# Patient Record
Sex: Male | Born: 1960 | Race: White | Hispanic: No | Marital: Married | Smoking: Current every day smoker
Health system: Southern US, Community
[De-identification: ages and names within clinical notes are randomized; demographics above are authoritative.]

## PROBLEM LIST (undated history)

## (undated) DIAGNOSIS — Z8739 Personal history of other diseases of the musculoskeletal system and connective tissue: Secondary | ICD-10-CM

## (undated) DIAGNOSIS — R519 Headache, unspecified: Secondary | ICD-10-CM

## (undated) DIAGNOSIS — I10 Essential (primary) hypertension: Secondary | ICD-10-CM

## (undated) DIAGNOSIS — K219 Gastro-esophageal reflux disease without esophagitis: Secondary | ICD-10-CM

## (undated) DIAGNOSIS — IMO0001 Reserved for inherently not codable concepts without codable children: Secondary | ICD-10-CM

## (undated) DIAGNOSIS — H9190 Unspecified hearing loss, unspecified ear: Secondary | ICD-10-CM

## (undated) DIAGNOSIS — R51 Headache: Secondary | ICD-10-CM

## (undated) DIAGNOSIS — E785 Hyperlipidemia, unspecified: Secondary | ICD-10-CM

## (undated) HISTORY — DX: Headache: R51

## (undated) HISTORY — DX: Headache, unspecified: R51.9

## (undated) HISTORY — DX: Unspecified hearing loss, unspecified ear: H91.90

## (undated) HISTORY — DX: Personal history of other diseases of the musculoskeletal system and connective tissue: Z87.39

## (undated) HISTORY — PX: KNEE SURGERY: SHX244

## (undated) HISTORY — DX: Hyperlipidemia, unspecified: E78.5

---

## 2008-09-23 ENCOUNTER — Emergency Department: Payer: Self-pay | Admitting: Emergency Medicine

## 2010-05-29 ENCOUNTER — Ambulatory Visit: Payer: Self-pay | Admitting: Internal Medicine

## 2012-05-30 ENCOUNTER — Ambulatory Visit: Payer: Self-pay

## 2012-05-30 LAB — CBC WITH DIFFERENTIAL/PLATELET
Basophil #: 0.1 10*3/uL (ref 0.0–0.1)
Basophil %: 1.2 %
Eosinophil #: 0.2 10*3/uL (ref 0.0–0.7)
Eosinophil %: 1.9 %
HCT: 42.3 % (ref 40.0–52.0)
HGB: 14.4 g/dL (ref 13.0–18.0)
Lymphocyte #: 2.9 10*3/uL (ref 1.0–3.6)
Lymphocyte %: 27.9 %
MCH: 31.6 pg (ref 26.0–34.0)
MCHC: 34.1 g/dL (ref 32.0–36.0)
MCV: 93 fL (ref 80–100)
Monocyte #: 0.9 x10 3/mm (ref 0.2–1.0)
Monocyte %: 8.5 %
Neutrophil #: 6.2 10*3/uL (ref 1.4–6.5)
Neutrophil %: 60.5 %
Platelet: 191 10*3/uL (ref 150–440)
RBC: 4.55 10*6/uL (ref 4.40–5.90)
RDW: 13.6 % (ref 11.5–14.5)
WBC: 10.3 10*3/uL (ref 3.8–10.6)

## 2012-05-30 LAB — COMPREHENSIVE METABOLIC PANEL
Albumin: 4.1 g/dL (ref 3.4–5.0)
Alkaline Phosphatase: 101 U/L (ref 50–136)
Anion Gap: 9 (ref 7–16)
BUN: 18 mg/dL (ref 7–18)
Bilirubin,Total: 0.4 mg/dL (ref 0.2–1.0)
Calcium, Total: 9.4 mg/dL (ref 8.5–10.1)
Chloride: 104 mmol/L (ref 98–107)
Co2: 30 mmol/L (ref 21–32)
Creatinine: 1.11 mg/dL (ref 0.60–1.30)
EGFR (African American): >60
EGFR (Non-African Amer.): >60
Glucose: 95 mg/dL (ref 65–99)
Osmolality: 287 (ref 275–301)
Potassium: 4.2 mmol/L (ref 3.5–5.1)
SGOT(AST): 15 U/L (ref 15–37)
SGPT (ALT): 23 U/L (ref 12–78)
Sodium: 143 mmol/L (ref 136–145)
Total Protein: 7.5 g/dL (ref 6.4–8.2)

## 2012-05-30 LAB — URIC ACID: Uric Acid: 5.7 mg/dL (ref 3.5–7.2)

## 2012-08-30 ENCOUNTER — Ambulatory Visit: Payer: Self-pay | Admitting: Family Medicine

## 2012-10-24 ENCOUNTER — Ambulatory Visit: Payer: Self-pay

## 2013-04-06 ENCOUNTER — Ambulatory Visit: Payer: Self-pay | Admitting: Gastroenterology

## 2013-04-10 LAB — PATHOLOGY REPORT

## 2013-05-07 ENCOUNTER — Ambulatory Visit: Payer: Self-pay | Admitting: Gastroenterology

## 2013-05-07 LAB — HM SIGMOIDOSCOPY

## 2013-05-10 LAB — PATHOLOGY REPORT

## 2013-10-19 ENCOUNTER — Ambulatory Visit: Payer: Self-pay

## 2013-10-27 ENCOUNTER — Ambulatory Visit: Payer: Self-pay

## 2014-06-11 LAB — LIPID PANEL
Cholesterol: 181 mg/dL (ref 0–200)
HDL: 37 mg/dL (ref 35–70)
LDL Cholesterol: 106 mg/dL
Triglycerides: 189 mg/dL — AB (ref 40–160)

## 2014-07-07 ENCOUNTER — Ambulatory Visit: Payer: BLUE CROSS/BLUE SHIELD

## 2014-07-07 ENCOUNTER — Ambulatory Visit
Admission: EM | Admit: 2014-07-07 | Discharge: 2014-07-07 | Disposition: A | Discharge status: Home / Self Care | Payer: BLUE CROSS/BLUE SHIELD | Attending: Internal Medicine | Admitting: Internal Medicine

## 2014-07-07 DIAGNOSIS — Z79899 Other long term (current) drug therapy: Secondary | ICD-10-CM | POA: Diagnosis not present

## 2014-07-07 DIAGNOSIS — F17210 Nicotine dependence, cigarettes, uncomplicated: Secondary | ICD-10-CM | POA: Diagnosis not present

## 2014-07-07 DIAGNOSIS — J189 Pneumonia, unspecified organism: Secondary | ICD-10-CM | POA: Insufficient documentation

## 2014-07-07 DIAGNOSIS — R509 Fever, unspecified: Secondary | ICD-10-CM | POA: Diagnosis present

## 2014-07-07 DIAGNOSIS — Z7982 Long term (current) use of aspirin: Secondary | ICD-10-CM | POA: Insufficient documentation

## 2014-07-07 DIAGNOSIS — R0981 Nasal congestion: Secondary | ICD-10-CM | POA: Diagnosis present

## 2014-07-07 DIAGNOSIS — R0602 Shortness of breath: Secondary | ICD-10-CM | POA: Diagnosis present

## 2014-07-07 DIAGNOSIS — K219 Gastro-esophageal reflux disease without esophagitis: Secondary | ICD-10-CM | POA: Insufficient documentation

## 2014-07-07 DIAGNOSIS — I10 Essential (primary) hypertension: Secondary | ICD-10-CM | POA: Insufficient documentation

## 2014-07-07 HISTORY — DX: Reserved for inherently not codable concepts without codable children: IMO0001

## 2014-07-07 HISTORY — DX: Essential (primary) hypertension: I10

## 2014-07-07 HISTORY — DX: Gastro-esophageal reflux disease without esophagitis: K21.9

## 2014-07-07 MED ORDER — IPRATROPIUM-ALBUTEROL 0.5-2.5 (3) MG/3ML IN SOLN
3.0000 mL | Freq: Once | RESPIRATORY_TRACT | Status: AC
  Administered 2014-07-07: 3 mL via RESPIRATORY_TRACT

## 2014-07-07 MED ORDER — LEVOFLOXACIN 500 MG PO TABS: 500.0000 mg | ORAL_TABLET | Freq: Every day | ORAL | Status: AC

## 2014-07-07 MED ORDER — BENZONATATE 200 MG PO CAPS
200.0000 mg | ORAL_CAPSULE | Freq: Three times a day (TID) | ORAL | Status: DC | PRN
Start: 2014-07-07 — End: 2015-01-07

## 2014-07-07 MED ORDER — PREDNISONE 20 MG PO TABS: 40.0000 mg | ORAL_TABLET | Freq: Every day | ORAL | Status: AC

## 2014-07-07 MED ORDER — ALBUTEROL SULFATE HFA 108 (90 BASE) MCG/ACT IN AERS: 2.0000 | INHALATION_SPRAY | RESPIRATORY_TRACT | Status: DC | PRN

## 2014-07-07 NOTE — ED Notes (Signed)
Started feeling sick Thursday. Started in chest and felt sore. Lots of congestion. Pain to sides of abdomen.

## 2014-07-07 NOTE — ED Provider Notes (Signed)
CSN: [id number]     Arrival date & time 07/07/14  1125 History   None    Chief Complaint  Patient presents with  . Shortness of Breath  . Nasal Congestion  . Fever   HPI Patient reports the onset of respiratory symptoms(cough, chest and head congestion, runny nose) 4 days ago, with fevers. This morning, getting out of the shower, he had the onset of dyspnea, and was alone at home, worried him. Patient has no history of bronchitis, asthma, COPD, but is a pack and a half a day smoker. No unusual leg pain or swelling, no history of blood clots.   Diagnosis Date  . Hypertension   . Reflux    History reviewed. No pertinent past surgical history. History reviewed. No pertinent family history. History  Substance Use Topics  . Smoking status: Current Every Day Smoker -- 1.50 packs/day for 35 years    Types: Cigarettes  . Smokeless tobacco: Never Used  . Alcohol Use: 1.2 oz/week    2 Cans of beer per week     Comment: weekends    Review of Systems  All other systems reviewed and are negative.   Allergies  Review of patient's allergies indicates no known allergies.  Home Medications   Prior to Admission medications   Medication Sig Start Date End Date Taking? Authorizing Provider  amLODipine (NORVASC) 10 MG tablet Take 10 mg by mouth daily.   Yes Historical Provider, MD  aspirin 81 MG tablet Take 81 mg by mouth daily.   Yes Historical Provider, MD  famotidine (PEPCID) 20 MG tablet Take 20 mg by mouth 2 (two) times daily.   Yes Historical Provider, MD   BP 132/70 mmHg  Pulse 87  Temp(Src) 99 F (37.2 C) (Tympanic)  Resp 26  Ht 6' (1.829 m)  Wt 252 lb (114.306 kg)  BMI 34.17 kg/m2  SpO2 93% Physical Exam  Constitutional: He is oriented to person, place, and time. He appears distressed (mild respiratory distress).  Alert, nicely groomed  HENT:  Head: Atraumatic.  Eyes:  Conjugate gaze, no eye redness/drainage  Neck: Neck supple.  Cardiovascular: Normal rate and regular  rhythm.   Quiet heart tones  Pulmonary/Chest: No respiratory distress. He has no wheezes. He has no rales.  Markedly diminished breath sounds posteriorly, symmetric breath sounds Patient is splinting, mildly. Room air oxygen saturation is 91%. He is able to speak in phrases.  Abdominal: Soft. He exhibits no distension.  Musculoskeletal: Normal range of motion.  Neurological: He is alert and oriented to person, place, and time.  Skin: Skin is warm and dry.  Faint duskiness of the lips and nose  Nursing note and vitals reviewed.   ED Course  Procedures  Labs Review Labs Reviewed - No data to display  Imaging Review Dg Chest 2 View  07/07/2014   CLINICAL DATA:  Hypoxia.  Cough.  EXAM: CHEST  2 VIEW  COMPARISON:  None.  FINDINGS: Ill-defined interstitial opacities are noted throughout the right lower lobe and right middle lobe consistent with interstitial pneumonia. Right upper lobe and left lung are clear. No pleural effusion or pneumothorax.  Cardiac silhouette is top-normal in size. No mediastinal or hilar masses. No convincing adenopathy.  Bony thorax is intact.  IMPRESSION: Right middle and lower lobe interstitial pneumonia.   Electronically Signed   By: Amie Portlandavid  Ormond M.D.   On: 07/07/2014 13:41    DuoNeb breathing treatment given in the urgent care 2, with improvement in air movement, and  respiratory distress. Patient's skin color pinked up as well. MDM   1. Community acquired pneumonia    Rx for Levaquin, prednisone burst, medicine. Patient will follow-up with his PCP, Gabriel Cirriheryl Wicker, in a week or so. He should have a chest x-ray repeated in 6-12 weeks to document clearing of the infiltrate.    Eustace MooreLaura W Dawon Troop, MD 07/07/14 [phone]

## 2014-07-07 NOTE — Discharge Instructions (Signed)
R middle and lower lobe pneumonia. Prescriptions for levaquin (antibiotic), prednisone (for chest tightness), and benzonatate (for cough) [facility] [place]. Followup with your pcp/Cheryl Jamesetta OrleansWicker next week. You will need a chest xray repeated in 6-12 weeks to look for improvement in the appearance of the pneumonia.  Pneumonia, Adult Pneumonia is an infection of the lungs. It may be caused by a germ (virus or bacteria). Some types of pneumonia can spread easily from person to person. This can happen when you cough or sneeze. HOME CARE  Only take medicine as told by your doctor.  Take your medicine (antibiotics) as told. Finish it even if you start to feel better.  Do not smoke.  You may use a vaporizer or humidifier in your room. This can help loosen thick spit (mucus).  Sleep so you are almost sitting up (semi-upright). This helps reduce coughing.  Rest. A shot (vaccine) can help prevent pneumonia. Shots are often advised for:  People over 54 years old.  Patients on chemotherapy.  People with long-term (chronic) lung problems.  People with immune system problems. GET HELP RIGHT AWAY IF:   You are getting worse.  You cannot control your cough, and you are losing sleep.  You cough up blood.  Your pain gets worse, even with medicine.  You have a fever.  Any of your problems are getting worse, not better.  You have shortness of breath or chest pain. MAKE SURE YOU:   Understand these instructions.  Will watch your condition.  Will get help right away if you are not doing well or get worse. Document Released: 08/04/2007 Document Revised: 05/10/2011 Document Reviewed: 05/08/2010 [facility] Patient Information 2015 [employer] [place]. This information is not intended to replace advice given to you by your health care provider. Make sure you discuss any questions you have with your health care provider.

## 2014-07-13 ENCOUNTER — Ambulatory Visit
Admission: EM | Admit: 2014-07-13 | Discharge: 2014-07-13 | Disposition: A | Discharge status: Home / Self Care | Payer: BLUE CROSS/BLUE SHIELD | Attending: Family Medicine | Admitting: Family Medicine

## 2014-07-13 DIAGNOSIS — R509 Fever, unspecified: Secondary | ICD-10-CM

## 2014-07-13 DIAGNOSIS — F17210 Nicotine dependence, cigarettes, uncomplicated: Secondary | ICD-10-CM | POA: Diagnosis not present

## 2014-07-13 DIAGNOSIS — I10 Essential (primary) hypertension: Secondary | ICD-10-CM | POA: Diagnosis not present

## 2014-07-13 DIAGNOSIS — R5381 Other malaise: Secondary | ICD-10-CM

## 2014-07-13 DIAGNOSIS — R5383 Other fatigue: Secondary | ICD-10-CM | POA: Diagnosis not present

## 2014-07-13 DIAGNOSIS — J189 Pneumonia, unspecified organism: Secondary | ICD-10-CM | POA: Insufficient documentation

## 2014-07-13 DIAGNOSIS — Z8701 Personal history of pneumonia (recurrent): Secondary | ICD-10-CM

## 2014-07-13 DIAGNOSIS — J441 Chronic obstructive pulmonary disease with (acute) exacerbation: Secondary | ICD-10-CM | POA: Insufficient documentation

## 2014-07-13 LAB — RAPID INFLUENZA A&B ANTIGENS
Influenza A (ARMC): NOT DETECTED
Influenza B (ARMC): NOT DETECTED

## 2014-07-13 NOTE — ED Provider Notes (Signed)
CSN: [id number]     Arrival date & time 07/13/14  1134 History   First MD Initiated Contact with Patient 07/13/14 1217     Chief Complaint  Patient presents with  . Fatigue   (Consider location/radiation/quality/duration/timing/severity/associated sxs/prior Treatment) Patient is a 54 y.o. male presenting with fever. The history is provided by the patient and the spouse. No language interpreter was used.  Fever Temp source:  Oral Severity:  Moderate Duration:  7 days Timing:  Intermittent Progression:  Worsening Relieved by:  Nothing Worsened by:  Exertion Associated symptoms: chills, congestion, cough and myalgias   Associated symptoms: no chest pain, no dysuria, no ear pain and no headaches   Congestion:    Location:  Chest Cough:    Cough characteristics:  Productive (Patient was seen last week and treated for pneumonia with Levaquin bronchoalveolar reports overall not feeling much better and today he felt weak and tired general body aches pains, pain and fever.)   Severity:  Moderate Risk factors: no hx of cancer, no immunosuppression, no occupational exposure and no recent surgery     Past Medical History  Diagnosis Date  . Hypertension   . Reflux    History reviewed. No pertinent past surgical history. History reviewed. No pertinent family history. History  Substance Use Topics  . Smoking status: Current Every Day Smoker -- 1.50 packs/day for 35 years    Types: Cigarettes  . Smokeless tobacco: Never Used  . Alcohol Use: 1.2 oz/week    2 Cans of beer per week     Comment: weekends    Review of Systems  Constitutional: Positive for fever and chills.  HENT: Positive for congestion. Negative for ear pain.   Respiratory: Positive for cough.   Cardiovascular: Negative for chest pain.  Genitourinary: Negative for dysuria.  Musculoskeletal: Positive for myalgias.  Neurological: Negative for headaches.    Allergies  Review of patient's allergies indicates no known  allergies.  Home Medications   Prior to Admission medications   Medication Sig Start Date End Date Taking? Authorizing Provider  albuterol (PROVENTIL HFA;VENTOLIN HFA) 108 (90 BASE) MCG/ACT inhaler Inhale 2 puffs into the lungs every 4 (four) hours as needed for wheezing or shortness of breath. 07/07/14   Eustace MooreLaura W Murray, MD  amLODipine (NORVASC) 10 MG tablet Take 10 mg by mouth daily.    Historical Provider, MD  aspirin 81 MG tablet Take 81 mg by mouth daily.    Historical Provider, MD  benzonatate (TESSALON) 200 MG capsule Take 1 capsule (200 mg total) by mouth 3 (three) times daily as needed for cough. 07/07/14   Eustace MooreLaura W Murray, MD  famotidine (PEPCID) 20 MG tablet Take 20 mg by mouth 2 (two) times daily.    Historical Provider, MD  levofloxacin (LEVAQUIN) 500 MG tablet Take 1 tablet (500 mg total) by mouth daily. 07/07/14 07/17/14  Eustace MooreLaura W Murray, MD   BP 158/75 mmHg  Pulse 86  Temp(Src) 100.4 F (38 C) (Tympanic)  Resp 20  Ht 6' (1.829 m)  Wt 252 lb (114.306 kg)  BMI 34.17 kg/m2  SpO2 95% Physical Exam  Constitutional: He is oriented to person, place, and time. He appears well-nourished. He appears distressed.  HENT:  Head: Normocephalic.  Neck: Neck supple. No tracheal deviation present.  Cardiovascular: Normal rate, regular rhythm and normal heart sounds.   Pulmonary/Chest: Breath sounds normal. No respiratory distress. He has no wheezes.  Patient was diagnosed with pneumonia last week. And place on Levaquin  Musculoskeletal: He  exhibits tenderness. He exhibits no edema.  Lymphadenopathy:    He has no cervical adenopathy.  Neurological: He is alert and oriented to person, place, and time.  Skin: Skin is warm and dry.  Vitals reviewed.   ED Course  Procedures (including critical care time) Labs Review Labs Reviewed  INFLUENZA A&B ANTIGENS([facility])    Imaging Review No results found. Results for orders placed or performed during the hospital encounter of 07/13/14  Influenza  A&B Antigens [facility])  Result Value Ref Range   Influenza A [facility]) NOT DETECTED    Influenza B [facility]) NOT DETECTED    Patient flu test is negative. Explained to him and his wife with a diagnosis of pneumonia last week treatment of Levaquin is still having a fever along with a mild is unlikely due to the fever this is a drug reaction more concerned with sepsis and/or other competitions of pneumonia. I recommend strongly cancer to the ED of the choice. We'll given the option of private car or EMS. MDM   1. Fever chills   2. Malaise and fatigue   3. Hx of bacterial pneumonia        Hassan RowanEugene Ladarrian Asencio, MD 07/14/14 1438

## 2014-07-13 NOTE — ED Notes (Signed)
Pt discharged and [facility]. Patient to go there by Private vehicle for further care.

## 2014-07-13 NOTE — ED Notes (Signed)
Pt seen here last week and treated for pneumonia. Pt today feeling weak and tired, general body aches and pain. Abd pain.

## 2014-07-13 NOTE — Discharge Instructions (Signed)
Recommend transferring to the ED of patient's choice for further evaluation and possible septic workup.

## 2014-11-06 ENCOUNTER — Other Ambulatory Visit: Payer: Self-pay

## 2014-11-06 MED ORDER — AMLODIPINE BESYLATE 10 MG PO TABS: 10.0000 mg | ORAL_TABLET | Freq: Every day | ORAL | Status: DC

## 2014-11-06 NOTE — Telephone Encounter (Signed)
Patient was last seen 06/11/14, practice partner number is [id number], and pharmacy is [facility] [place].

## 2014-12-04 ENCOUNTER — Other Ambulatory Visit: Payer: Self-pay | Admitting: Unknown Physician Specialty

## 2014-12-04 MED ORDER — AMLODIPINE BESYLATE 10 MG PO TABS: 10.0000 mg | ORAL_TABLET | Freq: Every day | ORAL | Status: DC

## 2014-12-04 NOTE — Telephone Encounter (Signed)
Called pt to reschedule his CPE appt from 12/17/14 to 01/07/15. Pt stated he will need a refill on Amlodopine to last until appt date. [facility] [place]. Thanks.

## 2014-12-04 NOTE — Telephone Encounter (Signed)
Routing to provider  

## 2014-12-17 ENCOUNTER — Encounter: Payer: Self-pay | Admitting: Unknown Physician Specialty

## 2015-01-03 DIAGNOSIS — E785 Hyperlipidemia, unspecified: Secondary | ICD-10-CM

## 2015-01-03 DIAGNOSIS — I10 Essential (primary) hypertension: Secondary | ICD-10-CM | POA: Insufficient documentation

## 2015-01-03 DIAGNOSIS — M25569 Pain in unspecified knee: Secondary | ICD-10-CM | POA: Insufficient documentation

## 2015-01-07 ENCOUNTER — Ambulatory Visit (INDEPENDENT_AMBULATORY_CARE_PROVIDER_SITE_OTHER): Payer: BLUE CROSS/BLUE SHIELD | Admitting: Unknown Physician Specialty

## 2015-01-07 ENCOUNTER — Encounter: Payer: Self-pay | Admitting: Unknown Physician Specialty

## 2015-01-07 VITALS — BP 174/89 | HR 67 | Temp 97.7°F | Ht 71.6 in | Wt 264.4 lb

## 2015-01-07 DIAGNOSIS — E785 Hyperlipidemia, unspecified: Secondary | ICD-10-CM

## 2015-01-07 DIAGNOSIS — I10 Essential (primary) hypertension: Secondary | ICD-10-CM | POA: Diagnosis not present

## 2015-01-07 DIAGNOSIS — B079 Viral wart, unspecified: Secondary | ICD-10-CM

## 2015-01-07 DIAGNOSIS — Z23 Encounter for immunization: Secondary | ICD-10-CM

## 2015-01-07 DIAGNOSIS — E669 Obesity, unspecified: Secondary | ICD-10-CM | POA: Diagnosis not present

## 2015-01-07 DIAGNOSIS — R351 Nocturia: Secondary | ICD-10-CM | POA: Diagnosis not present

## 2015-01-07 DIAGNOSIS — E8881 Metabolic syndrome: Secondary | ICD-10-CM | POA: Diagnosis not present

## 2015-01-07 DIAGNOSIS — Z0000 Encounter for general adult medical examination without abnormal findings: Secondary | ICD-10-CM

## 2015-01-07 LAB — BAYER DCA HB A1C WAIVED: HB A1C (BAYER DCA - WAIVED): 5.7 % (ref <7.0)

## 2015-01-07 LAB — LIPID PANEL W/O CHOL/HDL RATIO
Cholesterol, Total: 186 mg/dL (ref 100–199)
HDL: 39 mg/dL — ABNORMAL LOW (ref >39)
LDL Calculated: 124 mg/dL — ABNORMAL HIGH (ref 0–99)
Triglycerides: 116 mg/dL (ref 0–149)
VLDL Cholesterol Cal: 23 mg/dL (ref 5–40)

## 2015-01-07 MED ORDER — ATORVASTATIN CALCIUM 20 MG PO TABS: 20.0000 mg | ORAL_TABLET | Freq: Every day | ORAL | Status: DC

## 2015-01-07 MED ORDER — AMLODIPINE BESYLATE 10 MG PO TABS: 10.0000 mg | ORAL_TABLET | Freq: Every day | ORAL | Status: DC

## 2015-01-07 MED ORDER — IMIQUIMOD 5 % EX CREA: TOPICAL_CREAM | CUTANEOUS | Status: DC

## 2015-01-07 MED ORDER — LISINOPRIL 10 MG PO TABS: 10.0000 mg | ORAL_TABLET | Freq: Every day | ORAL | Status: DC

## 2015-01-07 NOTE — Assessment & Plan Note (Signed)
LDL is 124 which puts him in a statin benefit group

## 2015-01-07 NOTE — Progress Notes (Signed)
BP 174/89 mmHg  Pulse 67  Temp(Src) 97.7 F (36.5 C)  Ht 5' 11.6" (1.819 m)  Wt 264 lb 6.4 oz (119.931 kg)  BMI 36.25 kg/m2  SpO2 97%   Subjective:    Patient ID: Kenneth Blair, male    DOB: Sep 11, 1960, 54 y.o.   MRN: [id number]  HPI: [name] is a 54 y.o. male  Chief Complaint  Patient presents with  . Annual Exam  . Medication Refill    pt states he needs refill on albuterol inhaler   Hypertension Using medications without difficulty Average home BPs 127/75 last time checked  No problems or lightheadedness No chest pain with exertion or shortness of breath No Edema   Hyperlipidemia He has refused statins in the past.  LDL is 104 but recommended according to ASCVD calculator   Relevant past medical, surgical, family and social history reviewed and updated as indicated. Interim medical history since our last visit reviewed. Allergies and medications reviewed and updated.  Review of Systems  Constitutional: Negative.   HENT: Negative.   Eyes: Negative.   Respiratory: Negative.   Cardiovascular: Negative.   Gastrointestinal: Negative.   Endocrine: Negative.   Genitourinary: Negative.        Sometimes frequent urination at night.    Musculoskeletal: Negative.   Skin: Negative.        Has a wart that he used Aldara cream for in the past  Allergic/Immunologic: Negative.   Neurological: Negative.   Hematological: Negative.   Psychiatric/Behavioral: Negative.     Per HPI unless specifically indicated above     Objective:    BP 174/89 mmHg  Pulse 67  Temp(Src) 97.7 F (36.5 C)  Ht 5' 11.6" (1.819 m)  Wt 264 lb 6.4 oz (119.931 kg)  BMI 36.25 kg/m2  SpO2 97%  Wt Readings from Last 3 Encounters:  01/07/15 264 lb 6.4 oz (119.931 kg)  06/11/14 255 lb (115.667 kg)  07/13/14 252 lb (114.306 kg)    Physical Exam  Constitutional: He is oriented to person, place, and time. He appears well-developed and well-nourished.  HENT:  Head:  Normocephalic.  Eyes: Pupils are equal, round, and reactive to light.  Cardiovascular: Normal rate, regular rhythm and normal heart sounds.   Pulmonary/Chest: Effort normal.  Abdominal: Soft. Bowel sounds are normal.  Musculoskeletal: Normal range of motion.  Neurological: He is alert and oriented to person, place, and time. He has normal reflexes.  Skin: Skin is warm and dry.  Psychiatric: He has a normal mood and affect. His behavior is normal. Judgment and thought content normal.  Nursing note and vitals reviewed.     Assessment & Plan:   Problem List Items Addressed This Visit      Unprioritized   Hyperlipidemia    LDL is 124 which puts him in a statin benefit group      Relevant Orders   Lipid Panel w/o Chol/HDL Ratio   Hypertension    Not to goal.  Add Lisinopril to current treatment      Relevant Orders   Lipid Panel w/o Chol/HDL Ratio   Microalbumin, Urine Waived   Comprehensive metabolic panel    Other Visit Diagnoses    Immunization due    -  Primary    Relevant Orders    Flu Vaccine QUAD 36+ mos IM (Completed)    Nocturia        Relevant Orders    PSA    Comprehensive metabolic panel  Routine general medical examination at a health care facility        Relevant Orders    CBC with Differential/Platelet    Hepatitis C antibody    HIV antibody    PSA    TSH    Obesity        Relevant Orders    Bayer DCA Hb A1c Waived    Metabolic syndrome        Relevant Orders    Comprehensive metabolic panel    Wart            Follow up plan: Return in about 3 months (around 04/09/2015) for Lipid panel Microalbumin CMP.

## 2015-01-07 NOTE — Assessment & Plan Note (Addendum)
Not to goal.  Add Lisinopril to current treatment

## 2015-01-08 ENCOUNTER — Encounter: Payer: Self-pay | Admitting: Unknown Physician Specialty

## 2015-01-08 LAB — COMPREHENSIVE METABOLIC PANEL
ALT: 10 IU/L (ref 0–44)
AST: 14 IU/L (ref 0–40)
Albumin/Globulin Ratio: 2.1 (ref 1.1–2.5)
Albumin: 4.5 g/dL (ref 3.5–5.5)
Alkaline Phosphatase: 83 IU/L (ref 39–117)
BUN/Creatinine Ratio: 14 (ref 9–20)
BUN: 13 mg/dL (ref 6–24)
Bilirubin Total: 0.3 mg/dL (ref 0.0–1.2)
CO2: 23 mmol/L (ref 18–29)
Calcium: 9.3 mg/dL (ref 8.7–10.2)
Chloride: 100 mmol/L (ref 97–106)
Creatinine, Ser: 0.94 mg/dL (ref 0.76–1.27)
GFR calc Af Amer: 106 mL/min/{1.73_m2} (ref >59)
GFR calc non Af Amer: 92 mL/min/{1.73_m2} (ref >59)
Globulin, Total: 2.1 g/dL (ref 1.5–4.5)
Glucose: 95 mg/dL (ref 65–99)
Potassium: 4.1 mmol/L (ref 3.5–5.2)
Sodium: 141 mmol/L (ref 136–144)
Total Protein: 6.6 g/dL (ref 6.0–8.5)

## 2015-01-08 LAB — HIV ANTIBODY (ROUTINE TESTING W REFLEX): HIV Screen 4th Generation wRfx: NONREACTIVE

## 2015-01-08 LAB — TSH: TSH: 1.65 u[IU]/mL (ref 0.450–4.500)

## 2015-01-08 LAB — CBC WITH DIFFERENTIAL/PLATELET
Basophils Absolute: 0 10*3/uL (ref 0.0–0.2)
Basos: 0 %
EOS (ABSOLUTE): 0.1 10*3/uL (ref 0.0–0.4)
Eos: 2 %
Hematocrit: 42.8 % (ref 37.5–51.0)
Hemoglobin: 14.9 g/dL (ref 12.6–17.7)
Immature Grans (Abs): 0 10*3/uL (ref 0.0–0.1)
Immature Granulocytes: 0 %
Lymphocytes Absolute: 2.1 10*3/uL (ref 0.7–3.1)
Lymphs: 28 %
MCH: 32.8 pg (ref 26.6–33.0)
MCHC: 34.8 g/dL (ref 31.5–35.7)
MCV: 94 fL (ref 79–97)
Monocytes Absolute: 0.6 10*3/uL (ref 0.1–0.9)
Monocytes: 8 %
Neutrophils Absolute: 4.6 10*3/uL (ref 1.4–7.0)
Neutrophils: 62 %
Platelets: 198 10*3/uL (ref 150–379)
RBC: 4.54 x10E6/uL (ref 4.14–5.80)
RDW: 13.4 % (ref 12.3–15.4)
WBC: 7.5 10*3/uL (ref 3.4–10.8)

## 2015-01-08 LAB — PSA: Prostate Specific Ag, Serum: 0.4 ng/mL (ref 0.0–4.0)

## 2015-01-08 LAB — HEPATITIS C ANTIBODY: Hep C Virus Ab: <0.1 s/co ratio (ref 0.0–0.9)

## 2015-01-08 NOTE — Progress Notes (Signed)
Quick Note:  Normal labs. Patient notified by letter. ______ 

## 2015-04-09 ENCOUNTER — Encounter: Payer: Self-pay | Admitting: Unknown Physician Specialty

## 2015-04-09 ENCOUNTER — Ambulatory Visit (INDEPENDENT_AMBULATORY_CARE_PROVIDER_SITE_OTHER): Payer: BLUE CROSS/BLUE SHIELD | Admitting: Unknown Physician Specialty

## 2015-04-09 VITALS — BP 151/78 | HR 67 | Temp 98.1°F | Ht 70.3 in | Wt 258.4 lb

## 2015-04-09 DIAGNOSIS — I10 Essential (primary) hypertension: Secondary | ICD-10-CM | POA: Diagnosis not present

## 2015-04-09 DIAGNOSIS — E8881 Metabolic syndrome: Secondary | ICD-10-CM | POA: Diagnosis not present

## 2015-04-09 DIAGNOSIS — E785 Hyperlipidemia, unspecified: Secondary | ICD-10-CM | POA: Diagnosis not present

## 2015-04-09 LAB — MICROALBUMIN, URINE WAIVED
Creatinine, Urine Waived: 50 mg/dL (ref 10–300)
Microalb, Ur Waived: 10 mg/L (ref 0–19)
Microalb/Creat Ratio: <30 mg/g (ref <30)

## 2015-04-09 LAB — LIPID PANEL PICCOLO, WAIVED
Chol/HDL Ratio Piccolo,Waive: 3.3 mg/dL
Cholesterol Piccolo, Waived: 115 mg/dL (ref <200)
HDL Chol Piccolo, Waived: 35 mg/dL — ABNORMAL LOW (ref >59)
LDL Chol Calc Piccolo Waived: 57 mg/dL (ref <100)
Triglycerides Piccolo,Waived: 116 mg/dL (ref <150)
VLDL Chol Calc Piccolo,Waive: 23 mg/dL (ref <30)

## 2015-04-09 LAB — BAYER DCA HB A1C WAIVED: HB A1C (BAYER DCA - WAIVED): 5.6 % (ref <7.0)

## 2015-04-09 MED ORDER — LISINOPRIL-HYDROCHLOROTHIAZIDE 20-12.5 MG PO TABS: 1.0000 | ORAL_TABLET | Freq: Every day | ORAL | Status: DC

## 2015-04-09 NOTE — Progress Notes (Signed)
   BP 151/78 mmHg  Pulse 67  Temp(Src) 98.1 F (36.7 C)  Ht 5' 10.3" (1.786 m)  Wt 258 lb 6.4 oz (117.209 kg)  BMI 36.74 kg/m2  SpO2 97%   Subjective:    Patient ID: Kenneth Blair, male    DOB: [birth year], 55 y.o.   MRN: [id number]  HPI: Kenneth Blair is a 55 y.o. male  Chief Complaint  Patient presents with  . Hyperlipidemia  . Hypertension  . Medication Refill    pt states he needs lisinopril refilled   Hypertension Using medications without difficulty Average home BPs "about what they are here.     No problems or lightheadedness No chest pain with exertion or shortness of breath No Edema   Hyperlipidemia Using medications without problems: No Muscle aches  Diet compliance: Eats well.  Tries to watch his diet by cutting back on sweets and simple carbohydrates.   Exercise: none   Relevant past medical, surgical, family and social history reviewed and updated as indicated. Interim medical history since our last visit reviewed. Allergies and medications reviewed and updated.  Review of Systems  Per HPI unless specifically indicated above     Objective:    BP 151/78 mmHg  Pulse 67  Temp(Src) 98.1 F (36.7 C)  Ht 5' 10.3" (1.786 m)  Wt 258 lb 6.4 oz (117.209 kg)  BMI 36.74 kg/m2  SpO2 97%  Wt Readings from Last 3 Encounters:  04/09/15 258 lb 6.4 oz (117.209 kg)  01/07/15 264 lb 6.4 oz (119.931 kg)  06/11/14 255 lb (115.667 kg)    Physical Exam  Constitutional: He is oriented to person, place, and time. He appears well-developed and well-nourished. No distress.  HENT:  Head: Normocephalic and atraumatic.  Eyes: Conjunctivae and lids are normal. Right eye exhibits no discharge. Left eye exhibits no discharge. No scleral icterus.  Neck: Normal range of motion. Neck supple. No JVD present. Carotid bruit is not present.  Cardiovascular: Normal rate, regular rhythm and normal heart sounds.   Pulmonary/Chest: Effort normal and breath sounds normal. No  respiratory distress.  Abdominal: Normal appearance. There is no splenomegaly or hepatomegaly.  Musculoskeletal: Normal range of motion.  Neurological: He is alert and oriented to person, place, and time.  Skin: Skin is warm, dry and intact. No rash noted. No pallor.  Psychiatric: He has a normal mood and affect. His behavior is normal. Judgment and thought content normal.      Assessment & Plan:   Problem List Items Addressed This Visit      Unprioritized   Hyperlipidemia - Primary    Reviewed lipid panel.  LDL was 57.  Continue present medications.         Relevant Medications   lisinopril-hydrochlorothiazide (PRINZIDE,ZESTORETIC) 20-12.5 MG tablet   Other Relevant Orders   Lipid Panel Piccolo, Waived   Hypertension    Not to goal.  Change Lisinopril 10 mg to Lisinopril HCTZ.        Relevant Medications   lisinopril-hydrochlorothiazide (PRINZIDE,ZESTORETIC) 20-12.5 MG tablet   Other Relevant Orders   Microalbumin, Urine Waived    Other Visit Diagnoses    Metabolic syndrome        Hgb A1C is 5.6    Relevant Orders    Bayer DCA Hb A1c Waived        Follow up plan: Return in about 3 weeks (around 04/30/2015).

## 2015-04-09 NOTE — Patient Instructions (Signed)
DC Lisinopril 10 mg Start Lisinopril?HCTZ 20/12.5 mg

## 2015-04-09 NOTE — Assessment & Plan Note (Addendum)
Not to goal.  Change Lisinopril 10 mg to Lisinopril HCTZ.

## 2015-04-09 NOTE — Assessment & Plan Note (Signed)
Reviewed lipid panel.  LDL was 57.  Continue present medications.

## 2015-05-02 ENCOUNTER — Ambulatory Visit (INDEPENDENT_AMBULATORY_CARE_PROVIDER_SITE_OTHER): Payer: BLUE CROSS/BLUE SHIELD | Admitting: Unknown Physician Specialty

## 2015-05-02 ENCOUNTER — Encounter: Payer: Self-pay | Admitting: Unknown Physician Specialty

## 2015-05-02 VITALS — BP 152/90 | HR 63 | Temp 97.9°F | Ht 70.2 in | Wt 258.0 lb

## 2015-05-02 DIAGNOSIS — R059 Cough, unspecified: Secondary | ICD-10-CM

## 2015-05-02 DIAGNOSIS — R05 Cough: Secondary | ICD-10-CM | POA: Diagnosis not present

## 2015-05-02 DIAGNOSIS — J449 Chronic obstructive pulmonary disease, unspecified: Secondary | ICD-10-CM

## 2015-05-02 DIAGNOSIS — I10 Essential (primary) hypertension: Secondary | ICD-10-CM | POA: Diagnosis not present

## 2015-05-02 DIAGNOSIS — G4730 Sleep apnea, unspecified: Secondary | ICD-10-CM

## 2015-05-02 MED ORDER — FLUTICASONE PROPIONATE 50 MCG/ACT NA SUSP: 2.0000 | Freq: Every day | NASAL | Status: DC

## 2015-05-02 MED ORDER — ALBUTEROL SULFATE HFA 108 (90 BASE) MCG/ACT IN AERS
2.0000 | INHALATION_SPRAY | RESPIRATORY_TRACT | Status: DC | PRN
Start: 2015-05-02 — End: 2016-04-13

## 2015-05-02 MED ORDER — AMLODIPINE BESYLATE 10 MG PO TABS: 10.0000 mg | ORAL_TABLET | Freq: Every day | ORAL | Status: DC

## 2015-05-02 MED ORDER — ATORVASTATIN CALCIUM 20 MG PO TABS: 20.0000 mg | ORAL_TABLET | Freq: Every day | ORAL | Status: DC

## 2015-05-02 MED ORDER — LISINOPRIL-HYDROCHLOROTHIAZIDE 20-25 MG PO TABS: 1.0000 | ORAL_TABLET | Freq: Every day | ORAL | Status: DC

## 2015-05-02 NOTE — Assessment & Plan Note (Signed)
Schedule sleep stdudy

## 2015-05-02 NOTE — Assessment & Plan Note (Signed)
Patient not to goal. Discontinue Lisinopril HCTZ 20-12.5 mg. Start Lisinopril HCTZ 20-25mg .

## 2015-05-02 NOTE — Progress Notes (Signed)
BP 152/90 mmHg  Pulse 63  Temp(Src) 97.9 F (36.6 C)  Ht 5' 10.2" (1.783 m)  Wt 258 lb (117.028 kg)  BMI 36.81 kg/m2  SpO2 94%   Subjective:    Patient ID: Kenneth Blair, male    DOB: 10/02/1960, 55 y.o.   MRN: [id number]  HPI: Kenneth Blair is a 55 y.o. male  Chief Complaint  Patient presents with  . Hypertension   Hypertension Pt is here for a BP follow-up after being switched to Lisinopril/HCTZ from Lisininopril.  Also taking Amlodipine 10 mg. He states he had a stressful morning.   Using medications without difficulty Average home BPs Not checking.    No problems or lightheadedness No chest pain with exertion or shortness of breath No Edema Drinks a few beers on weekends.  He does snore loudly at night.  Falls asleep during the day.  Falls asleep suddenly a night.  He is using a vaporizer at night due to cold symptoms.    COPD Uses Albuterol 3-4 times during the week.  34 pack year of smoking.  Continues to smoke    Relevant past medical, surgical, family and social history reviewed and updated as indicated. Interim medical history since our last visit reviewed. Allergies and medications reviewed and updated.  Review of Systems  Constitutional: Negative.   HENT: Negative.   Eyes: Negative.   Respiratory: Positive for cough.   Cardiovascular: Negative.   Gastrointestinal: Negative.   Endocrine: Negative.   Genitourinary: Negative.   Skin: Negative.   Allergic/Immunologic: Negative.   Neurological: Negative.   Hematological: Negative.   Psychiatric/Behavioral: Negative.     Per HPI unless specifically indicated above     Objective:    BP 152/90 mmHg  Pulse 63  Temp(Src) 97.9 F (36.6 C)  Ht 5' 10.2" (1.783 m)  Wt 258 lb (117.028 kg)  BMI 36.81 kg/m2  SpO2 94%  Wt Readings from Last 3 Encounters:  05/02/15 258 lb (117.028 kg)  04/09/15 258 lb 6.4 oz (117.209 kg)  01/07/15 264 lb 6.4 oz (119.931 kg)    Physical Exam  Constitutional: He  is oriented to person, place, and time. He appears well-developed and well-nourished. No distress.  HENT:  Head: Normocephalic and atraumatic.  Eyes: Conjunctivae and lids are normal. Right eye exhibits no discharge. Left eye exhibits no discharge. No scleral icterus.  Neck: Normal range of motion. Neck supple. No JVD present. Carotid bruit is not present.  Cardiovascular: Normal rate, regular rhythm and normal heart sounds.   Pulmonary/Chest: Effort normal. No respiratory distress. He has decreased breath sounds.  Abdominal: Normal appearance. There is no splenomegaly or hepatomegaly.  Musculoskeletal: Normal range of motion.  Neurological: He is alert and oriented to person, place, and time.  Skin: Skin is warm, dry and intact. No rash noted. No pallor.  Psychiatric: He has a normal mood and affect. His behavior is normal. Judgment and thought content normal.   Spirometry shows mild restriction without any change post.  Results for orders placed or performed in visit on 04/09/15  Lipid Panel Piccolo, Arrow [employer]Waived  Result Value Ref Range   Cholesterol Piccolo, Waived 115 <200 mg/dL   HDL Chol Piccolo, Waived 35 (L) >59 mg/dL   Triglycerides Piccolo,Waived 116 <150 mg/dL   Chol/HDL Ratio Piccolo,Waive 3.3 mg/dL   LDL Chol Calc Piccolo Waived 57 <100 mg/dL   VLDL Chol Calc Piccolo,Waive 23 <30 mg/dL  Microalbumin, Urine Waived  Result Value Ref Range   Microalb, Ur  Waived 10 0 - 19 mg/L   Creatinine, Urine Waived 50 10 - 300 mg/dL   Microalb/Creat Ratio <30 <30 mg/g  Bayer DCA Hb A1c Waived  Result Value Ref Range   Bayer DCA Hb A1c Waived 5.6 <7.0 %      Assessment & Plan:   Problem List Items Addressed This Visit      Unprioritized   Hypertension - Primary    Patient not to goal. Discontinue Lisinopril HCTZ 20-12.5 mg. Start Lisinopril HCTZ 20-25mg .       Relevant Medications   lisinopril-hydrochlorothiazide (PRINZIDE,ZESTORETIC) 20-25 MG tablet   atorvastatin (LIPITOR) 20 MG  tablet   amLODipine (NORVASC) 10 MG tablet   COPD, mild (HCC)    Increase Albuterol use      Relevant Medications   albuterol (PROVENTIL HFA;VENTOLIN HFA) 108 (90 Base) MCG/ACT inhaler   fluticasone (FLONASE) 50 MCG/ACT nasal spray   Sleep apnea    Schedule sleep stdudy      Relevant Orders   Ambulatory referral to Sleep Studies    Other Visit Diagnoses    Cough        Mainly due to drainage.  Use Flonase.    Relevant Orders    Spirometry with Graph (Completed)        Follow up plan: Return for Needs CDL physical.

## 2015-05-02 NOTE — Assessment & Plan Note (Signed)
Increase Albuterol use

## 2015-05-19 ENCOUNTER — Encounter: Payer: Self-pay | Admitting: Unknown Physician Specialty

## 2015-05-19 ENCOUNTER — Ambulatory Visit (INDEPENDENT_AMBULATORY_CARE_PROVIDER_SITE_OTHER): Payer: Self-pay | Admitting: Unknown Physician Specialty

## 2015-05-19 VITALS — BP 127/69 | HR 64 | Temp 97.4°F | Ht 70.5 in | Wt 259.0 lb

## 2015-05-19 DIAGNOSIS — Z0000 Encounter for general adult medical examination without abnormal findings: Secondary | ICD-10-CM

## 2015-05-19 LAB — URINALYSIS, DIPSTICK ONLY
Bilirubin, UA: NEGATIVE
Glucose, UA: NEGATIVE
Ketones, UA: NEGATIVE
Leukocytes, UA: NEGATIVE
Nitrite, UA: NEGATIVE
Protein, UA: NEGATIVE
Specific Gravity, UA: 1.025 (ref 1.005–1.030)
Urobilinogen, Ur: 0.2 mg/dL (ref 0.2–1.0)
pH, UA: 5 (ref 5.0–7.5)

## 2015-05-19 NOTE — Progress Notes (Signed)
   BP 127/69 mmHg  Pulse 64  Temp(Src) 97.4 F (36.3 C)  Ht 5' 10.5" (1.791 m)  Wt 259 lb (117.482 kg)  BMI 36.63 kg/m2  SpO2 94%   Subjective:    Patient ID: Kenneth Blair, male    DOB: 10/30/1960, 55 y.o.   MRN: [id number]  HPI: Kenneth Blair is a 55 y.o. male  Chief Complaint  Patient presents with  . Employment Physical    DOT physical    Relevant past medical, surgical, family and social history reviewed and updated as indicated. Interim medical history since our last visit reviewed. Allergies and medications reviewed and updated.  Review of Systems  Per HPI unless specifically indicated above     Objective:    BP 127/69 mmHg  Pulse 64  Temp(Src) 97.4 F (36.3 C)  Ht 5' 10.5" (1.791 m)  Wt 259 lb (117.482 kg)  BMI 36.63 kg/m2  SpO2 94%  Wt Readings from Last 3 Encounters:  05/19/15 259 lb (117.482 kg)  05/02/15 258 lb (117.028 kg)  04/09/15 258 lb 6.4 oz (117.209 kg)    Physical Exam   See DOT form  Results for orders placed or performed in visit on 04/09/15  Lipid Panel Piccolo, Arrow ElectronicsWaived  Result Value Ref Range   Cholesterol Piccolo, Waived 115 <200 mg/dL   HDL Chol Piccolo, Waived 35 (L) >59 mg/dL   Triglycerides Piccolo,Waived 116 <150 mg/dL   Chol/HDL Ratio Piccolo,Waive 3.3 mg/dL   LDL Chol Calc Piccolo Waived 57 <100 mg/dL   VLDL Chol Calc Piccolo,Waive 23 <30 mg/dL  Microalbumin, Urine Waived  Result Value Ref Range   Microalb, Ur Waived 10 0 - 19 mg/L   Creatinine, Urine Waived 50 10 - 300 mg/dL   Microalb/Creat Ratio <30 <30 mg/g  Bayer DCA Hb A1c Waived  Result Value Ref Range   Bayer DCA Hb A1c Waived 5.6 <7.0 %      Assessment & Plan:   Problem List Items Addressed This Visit    None    Visit Diagnoses    Routine general medical examination at a health care facility    -  Primary    Relevant Orders    Urinalysis, dipstick only       OK for DOT for 1 year.    Follow up plan: No Follow-up on file.

## 2015-06-06 ENCOUNTER — Ambulatory Visit: Payer: BLUE CROSS/BLUE SHIELD | Attending: Specialist

## 2015-06-06 DIAGNOSIS — G4761 Periodic limb movement disorder: Secondary | ICD-10-CM | POA: Insufficient documentation

## 2015-06-06 DIAGNOSIS — G4733 Obstructive sleep apnea (adult) (pediatric): Secondary | ICD-10-CM | POA: Insufficient documentation

## 2015-06-06 DIAGNOSIS — G4730 Sleep apnea, unspecified: Secondary | ICD-10-CM | POA: Diagnosis not present

## 2015-06-10 DIAGNOSIS — G4761 Periodic limb movement disorder: Secondary | ICD-10-CM | POA: Diagnosis not present

## 2015-06-10 DIAGNOSIS — G4733 Obstructive sleep apnea (adult) (pediatric): Secondary | ICD-10-CM | POA: Diagnosis not present

## 2015-06-20 ENCOUNTER — Ambulatory Visit: Payer: BLUE CROSS/BLUE SHIELD | Attending: Specialist

## 2015-06-20 DIAGNOSIS — G4761 Periodic limb movement disorder: Secondary | ICD-10-CM | POA: Insufficient documentation

## 2015-06-20 DIAGNOSIS — G4733 Obstructive sleep apnea (adult) (pediatric): Secondary | ICD-10-CM | POA: Diagnosis not present

## 2015-07-02 DIAGNOSIS — G4733 Obstructive sleep apnea (adult) (pediatric): Secondary | ICD-10-CM | POA: Diagnosis not present

## 2015-08-02 DIAGNOSIS — G4733 Obstructive sleep apnea (adult) (pediatric): Secondary | ICD-10-CM | POA: Diagnosis not present

## 2015-09-01 DIAGNOSIS — G4733 Obstructive sleep apnea (adult) (pediatric): Secondary | ICD-10-CM | POA: Diagnosis not present

## 2015-10-02 DIAGNOSIS — G4733 Obstructive sleep apnea (adult) (pediatric): Secondary | ICD-10-CM | POA: Diagnosis not present

## 2015-10-02 IMAGING — CR DG CHEST 2V
3 series · 3 of 3 positions shown · non-contrast
Comparison: None.

CLINICAL DATA: Hypoxia.  Cough.

EXAM:
CHEST  2 VIEW

[chest pa]
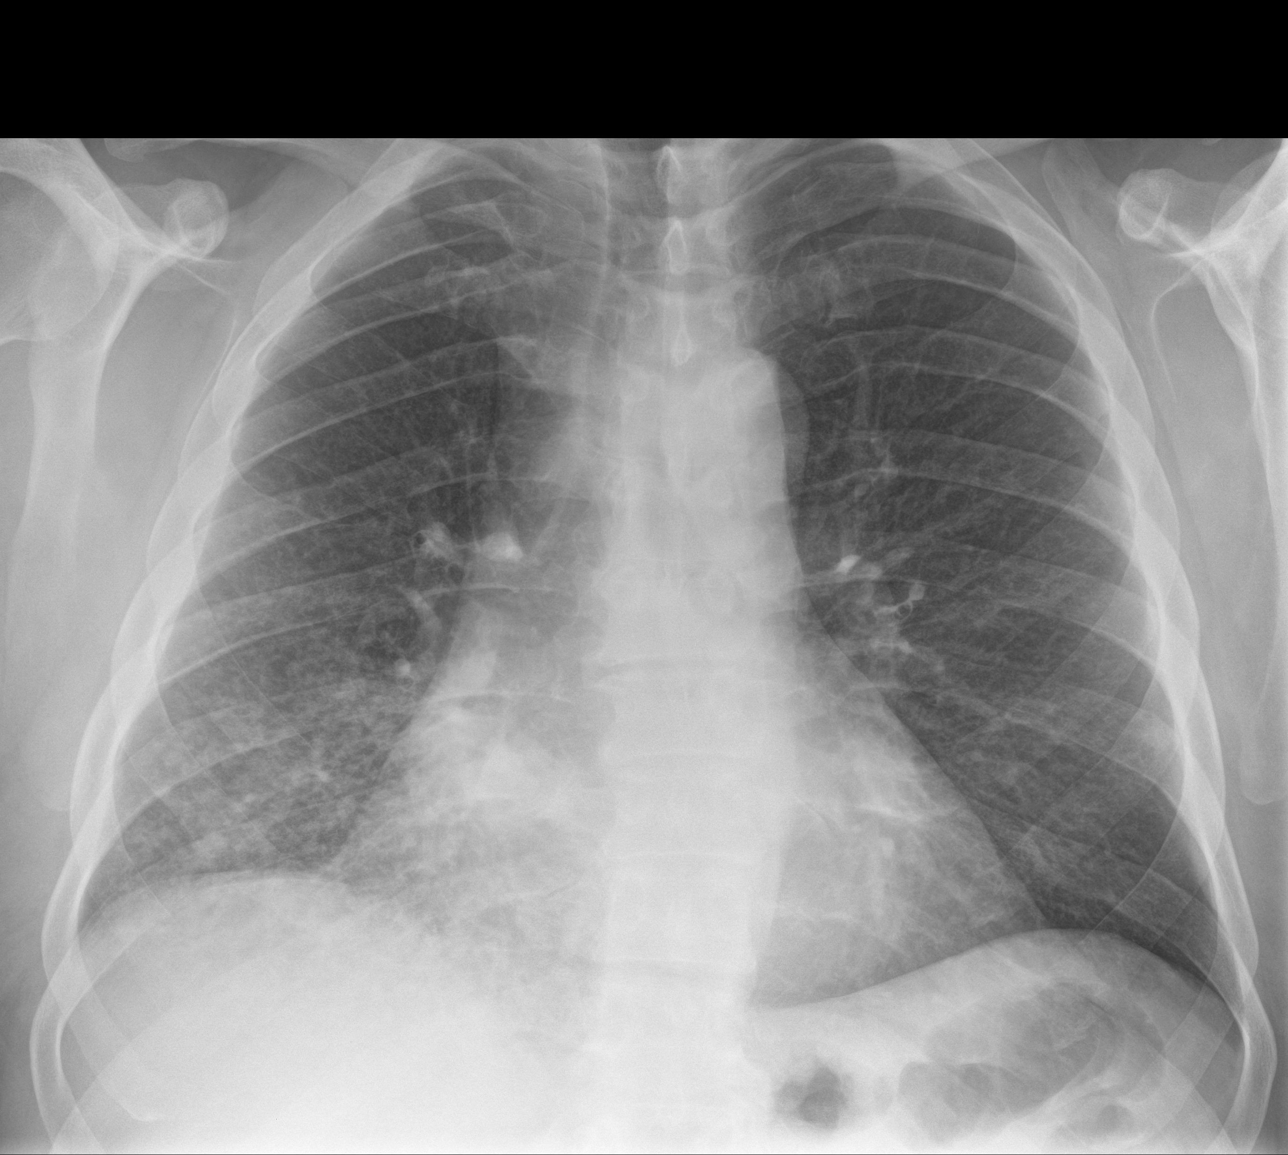

[chest lat (1 of 2)]
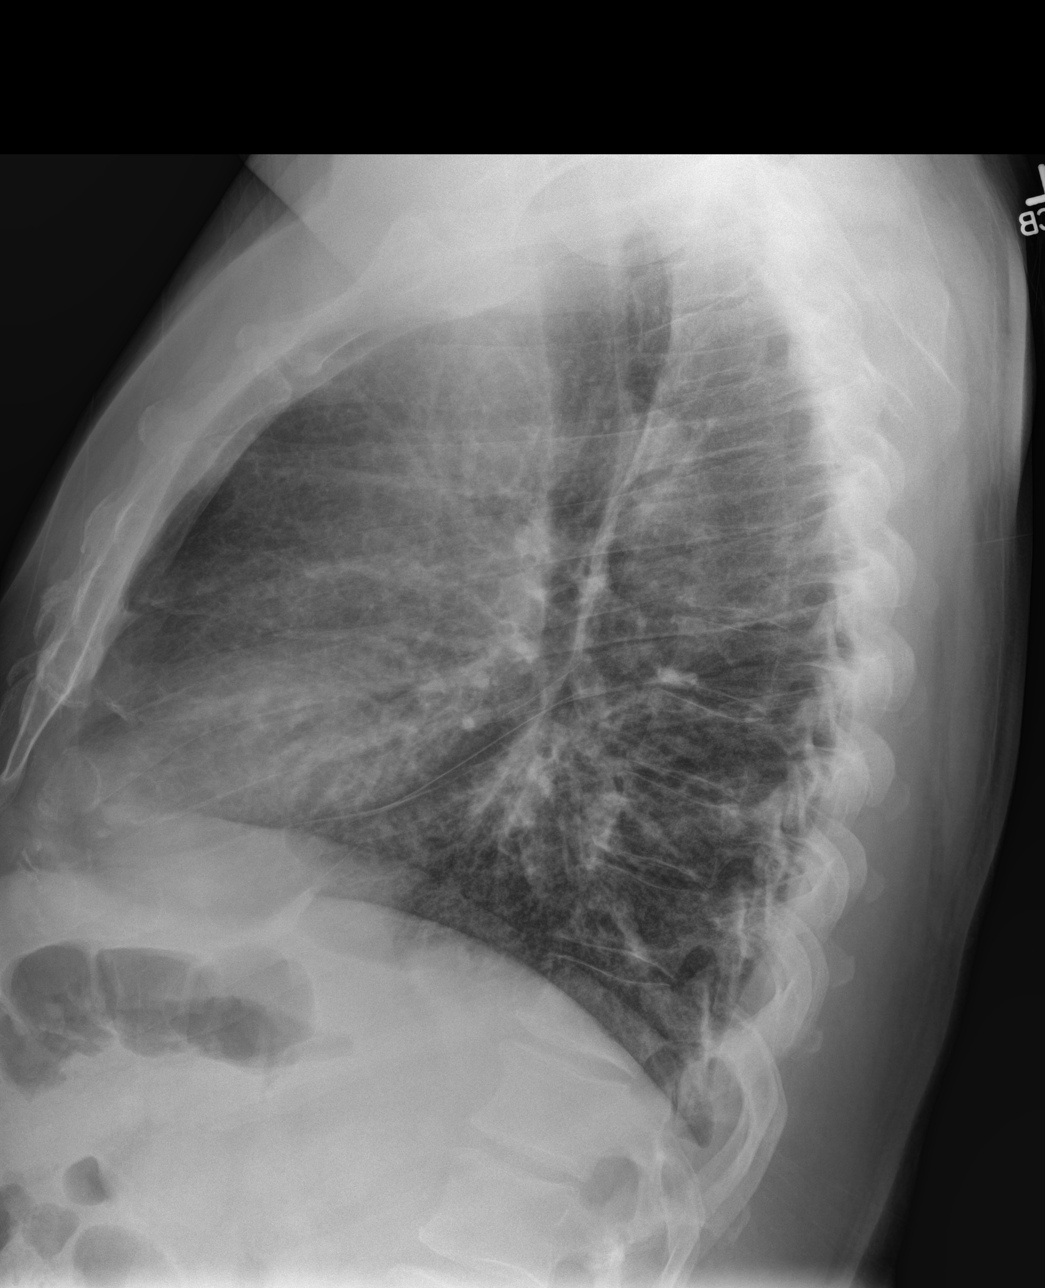

[chest lat (2 of 2)]
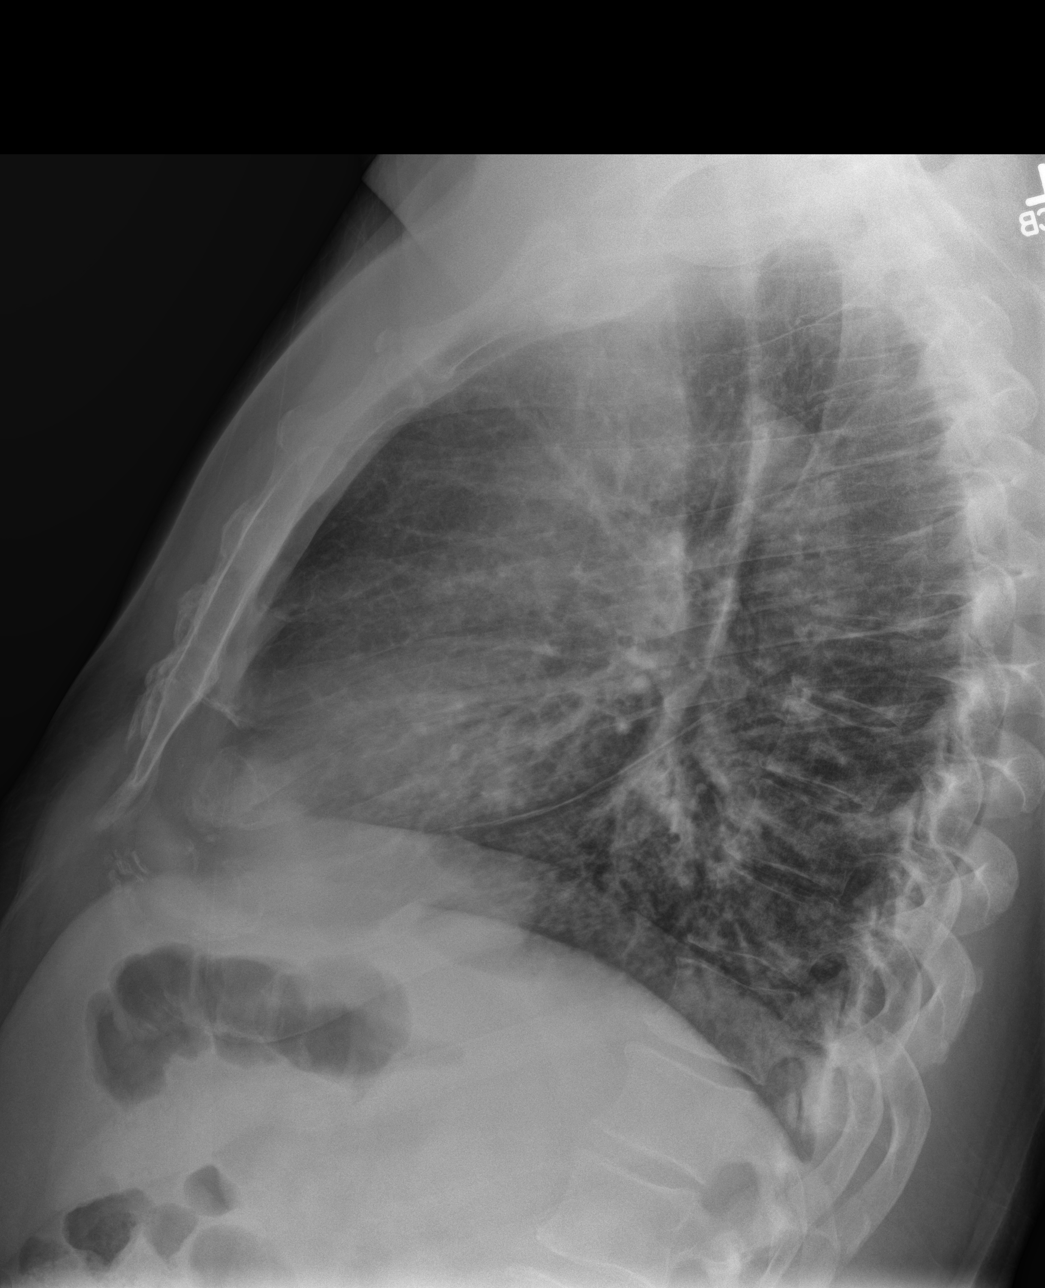

[3 of 3 positions shown; findings below may reference images not displayed]

FINDINGS: Ill-defined interstitial opacities are noted throughout the right
lower lobe and right middle lobe consistent with interstitial
pneumonia. Right upper lobe and left lung are clear. No pleural
effusion or pneumothorax.

Cardiac silhouette is top-normal in size. No mediastinal or hilar
masses. No convincing adenopathy.

Bony thorax is intact.
IMPRESSION: Right middle and lower lobe interstitial pneumonia.

## 2015-10-24 ENCOUNTER — Other Ambulatory Visit: Payer: Self-pay | Admitting: Unknown Physician Specialty

## 2015-10-24 DIAGNOSIS — I10 Essential (primary) hypertension: Secondary | ICD-10-CM

## 2015-10-24 NOTE — Telephone Encounter (Signed)
Your patient 

## 2015-11-02 DIAGNOSIS — G4733 Obstructive sleep apnea (adult) (pediatric): Secondary | ICD-10-CM | POA: Diagnosis not present

## 2015-11-07 ENCOUNTER — Other Ambulatory Visit: Payer: Self-pay | Admitting: Unknown Physician Specialty

## 2015-12-02 DIAGNOSIS — G4733 Obstructive sleep apnea (adult) (pediatric): Secondary | ICD-10-CM | POA: Diagnosis not present

## 2015-12-15 DIAGNOSIS — G4733 Obstructive sleep apnea (adult) (pediatric): Secondary | ICD-10-CM | POA: Diagnosis not present

## 2015-12-25 ENCOUNTER — Encounter (INDEPENDENT_AMBULATORY_CARE_PROVIDER_SITE_OTHER): Payer: Self-pay

## 2016-01-02 DIAGNOSIS — G4733 Obstructive sleep apnea (adult) (pediatric): Secondary | ICD-10-CM | POA: Diagnosis not present

## 2016-01-09 ENCOUNTER — Ambulatory Visit (INDEPENDENT_AMBULATORY_CARE_PROVIDER_SITE_OTHER): Payer: BLUE CROSS/BLUE SHIELD | Admitting: Unknown Physician Specialty

## 2016-01-09 ENCOUNTER — Encounter: Payer: Self-pay | Admitting: Unknown Physician Specialty

## 2016-01-09 VITALS — BP 120/79 | HR 65 | Temp 97.6°F | Ht 71.6 in | Wt 267.2 lb

## 2016-01-09 DIAGNOSIS — I10 Essential (primary) hypertension: Secondary | ICD-10-CM | POA: Diagnosis not present

## 2016-01-09 DIAGNOSIS — Z23 Encounter for immunization: Secondary | ICD-10-CM | POA: Diagnosis not present

## 2016-01-09 DIAGNOSIS — E6609 Other obesity due to excess calories: Secondary | ICD-10-CM | POA: Diagnosis not present

## 2016-01-09 DIAGNOSIS — E782 Mixed hyperlipidemia: Secondary | ICD-10-CM | POA: Diagnosis not present

## 2016-01-09 DIAGNOSIS — Z6836 Body mass index (BMI) 36.0-36.9, adult: Secondary | ICD-10-CM

## 2016-01-09 DIAGNOSIS — Z720 Tobacco use: Secondary | ICD-10-CM

## 2016-01-09 DIAGNOSIS — IMO0001 Reserved for inherently not codable concepts without codable children: Secondary | ICD-10-CM

## 2016-01-09 DIAGNOSIS — E669 Obesity, unspecified: Secondary | ICD-10-CM | POA: Insufficient documentation

## 2016-01-09 DIAGNOSIS — G4733 Obstructive sleep apnea (adult) (pediatric): Secondary | ICD-10-CM | POA: Diagnosis not present

## 2016-01-09 DIAGNOSIS — Z0000 Encounter for general adult medical examination without abnormal findings: Secondary | ICD-10-CM | POA: Diagnosis not present

## 2016-01-09 MED ORDER — SIMVASTATIN 20 MG PO TABS: 20.0000 mg | ORAL_TABLET | Freq: Every day | ORAL | 1 refills | Status: DC

## 2016-01-09 NOTE — Assessment & Plan Note (Addendum)
Change to Simvistatin 20 mg.  Check lipid panel

## 2016-01-09 NOTE — Patient Instructions (Addendum)

## 2016-01-09 NOTE — Assessment & Plan Note (Signed)
Discussed cutting out sugar processed food

## 2016-01-09 NOTE — Assessment & Plan Note (Signed)
Discussed.  Refusing low dose CT

## 2016-01-09 NOTE — Assessment & Plan Note (Signed)
On CPAP. ?

## 2016-01-09 NOTE — Progress Notes (Signed)
++++  BP 120/79 (BP Location: Left Arm, Cuff Size: Large)   Pulse 65   Temp 97.6 F (36.4 C)   Ht 5' 11.6" (1.819 m)   Wt 267 lb 3.2 oz (121.2 kg)   SpO2 99%   BMI 36.64 kg/m    Subjective:    Patient ID: Kenneth Blair, male    DOB: 10/16/1960, 55 y.o.   MRN: [id number]  HPI: Kenneth Blair is a 55 y.o. male  Chief Complaint  Patient presents with  . Annual Exam   Hypertension Using medications without difficulty Average home BPs   No problems or lightheadedness No chest pain with exertion or shortness of breath No Edema  Hyperlipidemia Not taking Atrovastatin No Muscle aches  Diet compliance:trying to watch it Exercise: not exercising  Smoking Cut back to 1 ppd.  Trying to quit.  +-  Relevant past medical, surgical, family and social history reviewed and updated as indicated. Interim medical history since our last visit reviewed. Allergies and medications reviewed and updated.  Review of Systems  Constitutional: Negative.   HENT: Negative.   Eyes: Negative.   Respiratory: Negative.   Cardiovascular: Negative.   Gastrointestinal: Negative.   Endocrine: Negative.   Genitourinary: Negative.   Skin: Negative.   Allergic/Immunologic: Negative.   Neurological: Negative.   Hematological: Negative.   Psychiatric/Behavioral: Negative.     Per HPI unless specifically indicated above     Objective:    BP 120/79 (BP Location: Left Arm, Cuff Size: Large)   Pulse 65   Temp 97.6 F (36.4 C)   Ht 5' 11.6" (1.819 m)   Wt 267 lb 3.2 oz (121.2 kg)   SpO2 99%   BMI 36.64 kg/m   Wt Readings from Last 3 Encounters:  01/09/16 267 lb 3.2 oz (121.2 kg)  05/19/15 259 lb (117.5 kg)  05/02/15 258 lb (117 kg)    Physical Exam  Constitutional: He is oriented to person, place, and time. He appears well-developed and well-nourished.  HENT:  Head: Normocephalic.  Right Ear: Tympanic membrane, external ear and ear canal normal.  Left Ear: Tympanic membrane,  external ear and ear canal normal.  Mouth/Throat: Uvula is midline, oropharynx is clear and moist and mucous membranes are normal.  Eyes: Pupils are equal, round, and reactive to light.  Cardiovascular: Normal rate, regular rhythm and normal heart sounds.  Exam reveals no gallop and no friction rub.   No murmur heard. Pulmonary/Chest: Effort normal and breath sounds normal. No respiratory distress.  Abdominal: Soft. Bowel sounds are normal. He exhibits no distension. There is no tenderness.  Musculoskeletal: Normal range of motion.  Neurological: He is alert and oriented to person, place, and time. He has normal reflexes.  Skin: Skin is warm and dry.  Psychiatric: He has a normal mood and affect. His behavior is normal. Judgment and thought content normal.       Assessment & Plan:   Problem List Items Addressed This Visit      Unprioritized   Hyperlipidemia    Change to Simvistatin 20 mg.  Check lipid panel      Relevant Medications   simvastatin (ZOCOR) 20 MG tablet   Hypertension    Stable, continue present medications.        Relevant Medications   simvastatin (ZOCOR) 20 MG tablet   Obesity    Discussed cutting out sugar processed food      Sleep apnea    On CPAP      Tobacco  abuse    Discussed.  Refusing low dose CT       Other Visit Diagnoses    Need for influenza vaccination    -  Primary   Relevant Orders   Flu Vaccine QUAD 36+ mos IM (Completed)   Annual physical exam           Follow up plan: Return in about 6 months (around 07/08/2016).

## 2016-01-09 NOTE — Assessment & Plan Note (Signed)
Stable, continue present medications.   

## 2016-01-10 LAB — LIPID PANEL W/O CHOL/HDL RATIO
Cholesterol, Total: 193 mg/dL (ref 100–199)
HDL: 31 mg/dL — ABNORMAL LOW (ref >39)
LDL Calculated: 121 mg/dL — ABNORMAL HIGH (ref 0–99)
Triglycerides: 203 mg/dL — ABNORMAL HIGH (ref 0–149)
VLDL Cholesterol Cal: 41 mg/dL — ABNORMAL HIGH (ref 5–40)

## 2016-01-10 LAB — CBC WITH DIFFERENTIAL/PLATELET
Basophils Absolute: 0 10*3/uL (ref 0.0–0.2)
Basos: 0 %
EOS (ABSOLUTE): 0.1 10*3/uL (ref 0.0–0.4)
Eos: 1 %
Hematocrit: 42.7 % (ref 37.5–51.0)
Hemoglobin: 14.4 g/dL (ref 12.6–17.7)
Immature Grans (Abs): 0 10*3/uL (ref 0.0–0.1)
Immature Granulocytes: 0 %
Lymphocytes Absolute: 2.7 10*3/uL (ref 0.7–3.1)
Lymphs: 31 %
MCH: 31.9 pg (ref 26.6–33.0)
MCHC: 33.7 g/dL (ref 31.5–35.7)
MCV: 95 fL (ref 79–97)
Monocytes Absolute: 0.6 10*3/uL (ref 0.1–0.9)
Monocytes: 7 %
Neutrophils Absolute: 5.3 10*3/uL (ref 1.4–7.0)
Neutrophils: 61 %
Platelets: 224 10*3/uL (ref 150–379)
RBC: 4.52 x10E6/uL (ref 4.14–5.80)
RDW: 13.5 % (ref 12.3–15.4)
WBC: 8.7 10*3/uL (ref 3.4–10.8)

## 2016-01-10 LAB — COMPREHENSIVE METABOLIC PANEL
ALT: 11 IU/L (ref 0–44)
AST: 12 IU/L (ref 0–40)
Albumin/Globulin Ratio: 2 (ref 1.2–2.2)
Albumin: 4.5 g/dL (ref 3.5–5.5)
Alkaline Phosphatase: 69 IU/L (ref 39–117)
BUN/Creatinine Ratio: 14 (ref 9–20)
BUN: 14 mg/dL (ref 6–24)
Bilirubin Total: 0.3 mg/dL (ref 0.0–1.2)
CO2: 27 mmol/L (ref 18–29)
Calcium: 9.6 mg/dL (ref 8.7–10.2)
Chloride: 99 mmol/L (ref 96–106)
Creatinine, Ser: 1.03 mg/dL (ref 0.76–1.27)
GFR calc Af Amer: 94 mL/min/{1.73_m2} (ref >59)
GFR calc non Af Amer: 81 mL/min/{1.73_m2} (ref >59)
Globulin, Total: 2.3 g/dL (ref 1.5–4.5)
Glucose: 97 mg/dL (ref 65–99)
Potassium: 4.2 mmol/L (ref 3.5–5.2)
Sodium: 140 mmol/L (ref 134–144)
Total Protein: 6.8 g/dL (ref 6.0–8.5)

## 2016-01-10 LAB — PSA: Prostate Specific Ag, Serum: 0.4 ng/mL (ref 0.0–4.0)

## 2016-01-10 LAB — TSH: TSH: 1.68 u[IU]/mL (ref 0.450–4.500)

## 2016-01-12 ENCOUNTER — Encounter: Payer: Self-pay | Admitting: Unknown Physician Specialty

## 2016-02-01 DIAGNOSIS — G4733 Obstructive sleep apnea (adult) (pediatric): Secondary | ICD-10-CM | POA: Diagnosis not present

## 2016-03-03 DIAGNOSIS — G4733 Obstructive sleep apnea (adult) (pediatric): Secondary | ICD-10-CM | POA: Diagnosis not present

## 2016-04-03 DIAGNOSIS — G4733 Obstructive sleep apnea (adult) (pediatric): Secondary | ICD-10-CM | POA: Diagnosis not present

## 2016-04-13 ENCOUNTER — Other Ambulatory Visit: Payer: Self-pay | Admitting: Unknown Physician Specialty

## 2016-04-13 DIAGNOSIS — J449 Chronic obstructive pulmonary disease, unspecified: Secondary | ICD-10-CM

## 2016-04-13 MED ORDER — ALBUTEROL SULFATE HFA 108 (90 BASE) MCG/ACT IN AERS: 2.0000 | INHALATION_SPRAY | RESPIRATORY_TRACT | 0 refills | Status: DC | PRN

## 2016-04-13 NOTE — Telephone Encounter (Signed)
Pt would like refill for albuterol (PROVENTIL HFA;VENTOLIN HFA) 108 (90 Base) MCG/ACT inhaler sent to [facility].

## 2016-04-13 NOTE — Telephone Encounter (Signed)
Routing to provider  

## 2016-04-23 ENCOUNTER — Other Ambulatory Visit: Payer: Self-pay | Admitting: Unknown Physician Specialty

## 2016-04-23 ENCOUNTER — Ambulatory Visit (INDEPENDENT_AMBULATORY_CARE_PROVIDER_SITE_OTHER): Payer: Self-pay | Admitting: Unknown Physician Specialty

## 2016-04-23 ENCOUNTER — Encounter: Payer: Self-pay | Admitting: Unknown Physician Specialty

## 2016-04-23 VITALS — BP 116/71 | HR 63 | Temp 97.4°F | Ht 71.1 in | Wt 262.1 lb

## 2016-04-23 DIAGNOSIS — I10 Essential (primary) hypertension: Secondary | ICD-10-CM

## 2016-04-23 DIAGNOSIS — Z021 Encounter for pre-employment examination: Secondary | ICD-10-CM

## 2016-04-23 DIAGNOSIS — Z0289 Encounter for other administrative examinations: Secondary | ICD-10-CM

## 2016-04-23 LAB — URINALYSIS, DIPSTICK ONLY
Bilirubin, UA: NEGATIVE
Glucose, UA: NEGATIVE
Ketones, UA: NEGATIVE
Leukocytes, UA: NEGATIVE
Nitrite, UA: NEGATIVE
Protein, UA: NEGATIVE
Specific Gravity, UA: 1.025 (ref 1.005–1.030)
Urobilinogen, Ur: 0.2 mg/dL (ref 0.2–1.0)
pH, UA: 6 (ref 5.0–7.5)

## 2016-04-23 NOTE — Progress Notes (Signed)
BP 116/71 (BP Location: Left Arm, Patient Position: Sitting, Cuff Size: Large)   Pulse 63   Temp 97.4 F (36.3 C)   Ht 5' 11.1" (1.806 m)   Wt 262 lb 1.6 oz (118.9 kg)   SpO2 95%   BMI 36.45 kg/m    Subjective:    Patient ID: Kenneth Blair, male    DOB: 04/02/1960, 56 y.o.   MRN: [id number]  HPI: Kenneth Blair is a 56 y.o. male  Chief Complaint  Patient presents with  . DOT Physical   See form Relevant past medical, surgical, family and social history reviewed and updated as indicated. Interim medical history since our last visit reviewed. Allergies and medications reviewed and updated.  Review of Systems  Per HPI unless specifically indicated above     Objective:    BP 116/71 (BP Location: Left Arm, Patient Position: Sitting, Cuff Size: Large)   Pulse 63   Temp 97.4 F (36.3 C)   Ht 5' 11.1" (1.806 m)   Wt 262 lb 1.6 oz (118.9 kg)   SpO2 95%   BMI 36.45 kg/m   Wt Readings from Last 3 Encounters:  04/23/16 262 lb 1.6 oz (118.9 kg)  01/09/16 267 lb 3.2 oz (121.2 kg)  05/19/15 259 lb (117.5 kg)    Physical Exam See form Results for orders placed or performed in visit on 01/09/16  CBC with Differential/Platelet  Result Value Ref Range   WBC 8.7 3.4 - 10.8 x10E3/uL   RBC 4.52 4.14 - 5.80 x10E6/uL   Hemoglobin 14.4 12.6 - 17.7 g/dL   Hematocrit 78.242.7 95.637.5 - 51.0 %   MCV 95 79 - 97 fL   MCH 31.9 26.6 - 33.0 pg   MCHC 33.7 31.5 - 35.7 g/dL   RDW 21.313.5 08.612.3 - 57.815.4 %   Platelets 224 150 - 379 x10E3/uL   Neutrophils 61 Not Estab. %   Lymphs 31 Not Estab. %   Monocytes 7 Not Estab. %   Eos 1 Not Estab. %   Basos 0 Not Estab. %   Neutrophils Absolute 5.3 1.4 - 7.0 x10E3/uL   Lymphocytes Absolute 2.7 0.7 - 3.1 x10E3/uL   Monocytes Absolute 0.6 0.1 - 0.9 x10E3/uL   EOS (ABSOLUTE) 0.1 0.0 - 0.4 x10E3/uL   Basophils Absolute 0.0 0.0 - 0.2 x10E3/uL   Immature Granulocytes 0 Not Estab. %   Immature Grans (Abs) 0.0 0.0 - 0.1 x10E3/uL  Comprehensive  metabolic panel  Result Value Ref Range   Glucose 97 65 - 99 mg/dL   BUN 14 6 - 24 mg/dL   Creatinine, Ser 4.691.03 0.76 - 1.27 mg/dL   GFR calc non Af Amer 81 >59 mL/min/1.73   GFR calc Af Amer 94 >59 mL/min/1.73   BUN/Creatinine Ratio 14 9 - 20   Sodium 140 134 - 144 mmol/L   Potassium 4.2 3.5 - 5.2 mmol/L   Chloride 99 96 - 106 mmol/L   CO2 27 18 - 29 mmol/L   Calcium 9.6 8.7 - 10.2 mg/dL   Total Protein 6.8 6.0 - 8.5 g/dL   Albumin 4.5 3.5 - 5.5 g/dL   Globulin, Total 2.3 1.5 - 4.5 g/dL   Albumin/Globulin Ratio 2.0 1.2 - 2.2   Bilirubin Total 0.3 0.0 - 1.2 mg/dL   Alkaline Phosphatase 69 39 - 117 IU/L   AST 12 0 - 40 IU/L   ALT 11 0 - 44 IU/L  Lipid Panel w/o Chol/HDL Ratio  Result Value Ref Range  Cholesterol, Total 193 100 - 199 mg/dL   Triglycerides 161 (H) 0 - 149 mg/dL   HDL 31 (L) >09 mg/dL   VLDL Cholesterol Cal 41 (H) 5 - 40 mg/dL   LDL Calculated 604 (H) 0 - 99 mg/dL  PSA  Result Value Ref Range   Prostate Specific Ag, Serum 0.4 0.0 - 4.0 ng/mL  TSH  Result Value Ref Range   TSH 1.680 0.450 - 4.500 uIU/mL      Assessment & Plan:   Problem List Items Addressed This Visit    None    Visit Diagnoses    Encounter for physical examination related to employment    -  Primary   Relevant Orders   Urinalysis, dipstick only      OK for 1 year  Follow up plan: No Follow-up on file.

## 2016-05-05 ENCOUNTER — Other Ambulatory Visit: Payer: Self-pay | Admitting: Family Medicine

## 2016-05-05 NOTE — Telephone Encounter (Signed)
Your patient 

## 2016-07-09 ENCOUNTER — Ambulatory Visit: Payer: BLUE CROSS/BLUE SHIELD | Admitting: Unknown Physician Specialty

## 2016-10-17 ENCOUNTER — Other Ambulatory Visit: Payer: Self-pay | Admitting: Unknown Physician Specialty

## 2016-10-17 DIAGNOSIS — I10 Essential (primary) hypertension: Secondary | ICD-10-CM

## 2016-10-27 ENCOUNTER — Other Ambulatory Visit: Payer: Self-pay | Admitting: Unknown Physician Specialty

## 2017-01-01 ENCOUNTER — Ambulatory Visit
Admission: EM | Admit: 2017-01-01 | Discharge: 2017-01-01 | Disposition: A | Discharge status: Home / Self Care | Payer: BLUE CROSS/BLUE SHIELD | Attending: Emergency Medicine | Admitting: Emergency Medicine

## 2017-01-01 ENCOUNTER — Ambulatory Visit (INDEPENDENT_AMBULATORY_CARE_PROVIDER_SITE_OTHER): Payer: BLUE CROSS/BLUE SHIELD

## 2017-01-01 ENCOUNTER — Encounter: Payer: Self-pay | Admitting: Emergency Medicine

## 2017-01-01 DIAGNOSIS — M542 Cervicalgia: Secondary | ICD-10-CM

## 2017-01-01 DIAGNOSIS — M5030 Other cervical disc degeneration, unspecified cervical region: Secondary | ICD-10-CM | POA: Diagnosis not present

## 2017-01-01 DIAGNOSIS — S161XXA Strain of muscle, fascia and tendon at neck level, initial encounter: Secondary | ICD-10-CM

## 2017-01-01 MED ORDER — METAXALONE 800 MG PO TABS: 800.0000 mg | ORAL_TABLET | Freq: Three times a day (TID) | ORAL | 0 refills | Status: DC

## 2017-01-01 MED ORDER — NAPROXEN 500 MG PO TABS: 500.0000 mg | ORAL_TABLET | Freq: Two times a day (BID) | ORAL | 0 refills | Status: DC

## 2017-01-01 MED ORDER — DIAZEPAM 5 MG PO TABS: ORAL_TABLET | ORAL | 0 refills | Status: DC

## 2017-01-01 MED ORDER — KETOROLAC TROMETHAMINE 60 MG/2ML IM SOLN
60.0000 mg | Freq: Once | INTRAMUSCULAR | Status: AC
  Administered 2017-01-01: 60 mg via INTRAMUSCULAR

## 2017-01-01 NOTE — Discharge Instructions (Signed)
Use ice to your neck 20 minutes out of every 2 hours 4-5 times daily

## 2017-01-01 NOTE — ED Provider Notes (Signed)
[facility]    CSN: [id number] Arrival date & time: 01/01/17  1331     History   Chief Complaint Chief Complaint  Patient presents with  . Neck Pain    left side    HPI Kenneth Blair is a 56 y.o. male.   HPI  Is 56 year old male is with a nonradiating neck pain that is mostly left-sided. He is accompanied by his wife. Last Sunday he was coming out of the choir loft when his leg gave out and he strained not to fall. 2 days later he began to have left-sided neck pain. Any neck range of motion is very uncomfortable. He has radiation only into the trapezial muscles and intrascapular early. He has no arm radiation. He finds it very difficult to sleep at night. He states that the last 2 days when he awakens in the morning his pain is much less that by lunchtime and shortly afterwards the pain becomes almost unbearable. He does not have any past history of injuring his neck.      Past Medical History:  Diagnosis Date  . Head ache   . History of knee problem   . Hyperlipidemia   . Hypertension   . Problems with hearing   . Reflux     Patient Active Problem List   Diagnosis Date Noted  . Tobacco abuse 01/09/2016  . Obesity 01/09/2016  . 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 Sleep apnea 05/02/2015  . Hyperlipidemia 01/03/2015  . Hypertension 01/03/2015  . Knee pain 01/03/2015    Past Surgical History:  Procedure Laterality Date  . KNEE SURGERY Left        Home Medications    Prior to Admission medications   Medication Sig Start Date End Date Taking? Authorizing Provider  albuterol (PROVENTIL HFA;VENTOLIN HFA) 108 (90 Base) MCG/ACT inhaler Inhale 2 puffs into the lungs every 4 (four) hours as needed for wheezing or shortness of breath. 04/13/16   Gabriel Cirri, NP  amLODipine (NORVASC) 10 MG tablet TAKE 1 TABLET (10 MG TOTAL) BY MOUTH DAILY. 10/27/16   Gabriel Cirri, NP  aspirin 81 MG tablet Take 81 mg by mouth daily.    [provider]  diazepam  (VALIUM) 5 MG tablet Take 1 tablet 3 times a day for muscle spasm 01/01/17   Lutricia Feil, PA-C  famotidine (PEPCID) 20 MG tablet Take 20 mg by mouth daily.     [provider]  fluticasone (FLONASE) 50 MCG/ACT nasal spray Place 2 sprays into both nostrils daily. 05/02/15   Gabriel Cirri, NP  lisinopril-hydrochlorothiazide (PRINZIDE,ZESTORETIC) 20-25 MG tablet TAKE 1 TABLET BY MOUTH DAILY. 10/18/16   Gabriel Cirri, NP  metaxalone (SKELAXIN) 800 MG tablet Take 1 tablet (800 mg total) by mouth 3 (three) times daily. Her taking this medication only after completing the Valium 01/01/17   Lutricia Feil, PA-C  Multiple Vitamin (MULTI-VITAMINS) TABS Take 1 tablet by mouth daily.     [provider]  naproxen (NAPROSYN) 500 MG tablet Take 1 tablet (500 mg total) by mouth 2 (two) times daily with a meal. 01/01/17   Lutricia Feil, PA-C    Family History Family History  Problem Relation Age of Onset  . Heart disease Mother   . Hypertension Mother   . Stroke Mother   . Asthma Father   . Heart disease Father   . Hypertension Father   . Cancer Brother        Bone  . Hypertension Brother   .  Hypertension Son     Social History Social History  Substance Use Topics  . Smoking status: Current Every Day Smoker    Packs/day: 1.00    Years: 35.00    Types: Cigarettes  . Smokeless tobacco: Never Used  . Alcohol use 1.8 oz/week    3 Cans of beer per week     Comment: weekends     Allergies   Patient has no known allergies.   Review of Systems Review of Systems  Constitutional: Positive for activity change. Negative for appetite change, chills, fatigue and fever.  Musculoskeletal: Positive for neck pain and neck stiffness.  All other systems reviewed and are negative.    Physical Exam Triage Vital Signs ED Triage Vitals  Enc Vitals Group     BP 01/01/17 1409 139/70     Pulse Rate 01/01/17 1409 64     Resp 01/01/17 1409 16     Temp 01/01/17 1409 98.4 F  (36.9 C)     Temp Source 01/01/17 1409 Oral     SpO2 01/01/17 1409 97 %     Weight 01/01/17 1407 259 lb (117.5 kg)     Height 01/01/17 1407 6' (1.829 m)     Head Circumference --      Peak Flow --      Pain Score 01/01/17 1408 6     Pain Loc --      Pain Edu? --      Excl. in GC? --    No data found.   Updated Vital Signs BP 139/70 (BP Location: Left Arm)   Pulse 64   Temp 98.4 F (36.9 C) (Oral)   Resp 16   Ht 6' (1.829 m)   Wt 259 lb (117.5 kg)   SpO2 97%   BMI 35.13 kg/m   Visual Acuity Right Eye Distance:   Left Eye Distance:   Bilateral Distance:    Right Eye Near:   Left Eye Near:    Bilateral Near:     Physical Exam  Constitutional: He is oriented to person, place, and time. He appears well-developed and well-nourished. No distress.  HENT:  Head: Normocephalic.  Eyes: Pupils are equal, round, and reactive to light. Right eye exhibits no discharge. Left eye exhibits no discharge.  Neck:  Examination of his cervical spine shows marked limitation of motion in all planes. Maximum discomfort is with extension particularly with lateral flexion and extension. Upper extremity strength is intact. Sensation is intact to light touch throughout. DTRs 2+ over 4 and symmetrical.  Musculoskeletal: Normal range of motion. He exhibits tenderness.  Refer to neck physical exam  Lymphadenopathy:    He has no cervical adenopathy.  Neurological: He is alert and oriented to person, place, and time.  Skin: Skin is warm and dry. He is not diaphoretic.  Psychiatric: He has a normal mood and affect. His behavior is normal. Judgment and thought content normal.  Nursing note and vitals reviewed.    UC Treatments / Results  Labs (all labs ordered are listed, but only abnormal results are displayed) Labs Reviewed - No data to display  EKG  EKG Interpretation None       Radiology Dg Cervical Spine Complete  Result Date: 01/01/2017 CLINICAL DATA:  Left neck pain for the  past 4 days. No known injury. EXAM: CERVICAL SPINE - COMPLETE 4+ VIEW COMPARISON:  None. FINDINGS: Straightening of the normal cervical lordosis. Moderate disc space narrowing at the C5-6 and C6-7 levels with moderate anterior  spur formation at the was levels, mild to moderate posterior spur formation at the C5-6 level and mild posterior spur formation at the C6-7 level. Mild anterior spur formation and minimal posterior spur formation at the C4-5 level. Minimal anterior spur formation at the C3-4 level. Bilateral uncinate spurs producing moderate bilateral foraminal stenosis at the C5-6 level and mild to moderate bilateral foraminal stenosis at the C6-7 level. IMPRESSION: Degenerative changes, as described above. Electronically Signed   By: Beckie Salts M.D.   On: 01/01/2017 15:38    Procedures Procedures (including critical care time)  Medications Ordered in UC Medications  ketorolac (TORADOL) injection 60 mg (60 mg Intramuscular Given 01/01/17 1456)  Patient's pain decreased from a 6 out of 10-4 out of 10 after the injection    Initial Impression / Assessment and Plan / UC Course  I have reviewed the triage vital signs and the nursing notes.  Pertinent labs & imaging results that were available during my care of the patient were reviewed by me and considered in my medical decision making (see chart for details).     *Plan: 1. Test/x-ray results and diagnosis reviewed with patient 2. rx as per orders; risks, benefits, potential side effects reviewed with patient 3. Recommend supportive treatment with rest ,symptom avoidance and ice to the area and patient was cautioned regarding the use of Valium or muscle relaxers with that activity requiring concentration and judgment and not driving while taking these medications. 4. F/u prn if symptoms worsen or don't improve   Final Clinical Impressions(s) / UC Diagnoses   Final diagnoses:  DDD (degenerative disc disease), cervical  Acute strain  of neck muscle, initial encounter    New Prescriptions New Prescriptions   DIAZEPAM (VALIUM) 5 MG TABLET    Take 1 tablet 3 times a day for muscle spasm   METAXALONE (SKELAXIN) 800 MG TABLET    Take 1 tablet (800 mg total) by mouth 3 (three) times daily. Her taking this medication only after completing the Valium   NAPROXEN (NAPROSYN) 500 MG TABLET    Take 1 tablet (500 mg total) by mouth 2 (two) times daily with a meal.     Controlled Substance Prescriptions [place] Controlled Substance Registry consulted? Not Applicable   Lutricia Feil, PA-C 01/01/17 1600

## 2017-01-01 NOTE — ED Notes (Signed)
Patient shows no signs of adverse reaction to medication at this time.  

## 2017-01-01 NOTE — ED Triage Notes (Signed)
Patient c/o left sided neck pain that started on Tuesday.  Patient denies injury or fall.

## 2017-01-03 ENCOUNTER — Other Ambulatory Visit: Payer: Self-pay | Admitting: Unknown Physician Specialty

## 2017-01-03 DIAGNOSIS — J449 Chronic obstructive pulmonary disease, unspecified: Secondary | ICD-10-CM

## 2017-01-03 MED ORDER — ALBUTEROL SULFATE HFA 108 (90 BASE) MCG/ACT IN AERS: 2.0000 | INHALATION_SPRAY | RESPIRATORY_TRACT | 0 refills | Status: DC | PRN

## 2017-01-03 NOTE — Telephone Encounter (Signed)
Copied from CRM #[id number]. Topic: Quick Communication - See Telephone Encounter >> Jan 03, 2017 10:29 AM Diana EvesHoyt, Maryann B wrote: CRM for notification. See Telephone encounter for:  Pt calling about his inhaler. He's needing it refilled and he got a text from [facility] saying it was denied on getting it filled. Hes not sure if he needs to come in and be retested to see if he needs inhaler or what he needs to do.   01/03/17.

## 2017-01-03 NOTE — Telephone Encounter (Signed)
Routing to provider. Patient last seen in November 2017 for physical. Please route back to me so I can call and see about scheduling the patient a f/up or physical.

## 2017-01-03 NOTE — Telephone Encounter (Signed)
Called and scheduled patient's physical for 01/28/2017 at 10 am.

## 2017-01-28 ENCOUNTER — Encounter: Payer: Self-pay | Admitting: Unknown Physician Specialty

## 2017-01-28 ENCOUNTER — Ambulatory Visit (INDEPENDENT_AMBULATORY_CARE_PROVIDER_SITE_OTHER): Payer: BLUE CROSS/BLUE SHIELD | Admitting: Unknown Physician Specialty

## 2017-01-28 VITALS — BP 132/76 | HR 60 | Temp 98.0°F | Ht 71.4 in | Wt 262.6 lb

## 2017-01-28 DIAGNOSIS — M25562 Pain in left knee: Secondary | ICD-10-CM | POA: Diagnosis not present

## 2017-01-28 DIAGNOSIS — Z0000 Encounter for general adult medical examination without abnormal findings: Secondary | ICD-10-CM

## 2017-01-28 DIAGNOSIS — E782 Mixed hyperlipidemia: Secondary | ICD-10-CM | POA: Diagnosis not present

## 2017-01-28 DIAGNOSIS — Z720 Tobacco use: Secondary | ICD-10-CM | POA: Diagnosis not present

## 2017-01-28 DIAGNOSIS — I10 Essential (primary) hypertension: Secondary | ICD-10-CM | POA: Diagnosis not present

## 2017-01-28 DIAGNOSIS — Z23 Encounter for immunization: Secondary | ICD-10-CM

## 2017-01-28 DIAGNOSIS — J449 Chronic obstructive pulmonary disease, unspecified: Secondary | ICD-10-CM | POA: Diagnosis not present

## 2017-01-28 DIAGNOSIS — G4733 Obstructive sleep apnea (adult) (pediatric): Secondary | ICD-10-CM | POA: Diagnosis not present

## 2017-01-28 NOTE — Progress Notes (Signed)
BP 132/76   Pulse 60   Temp 98 F (36.7 C) (Oral)   Ht 5' 11.4" (1.814 m)   Wt 262 lb 9.6 oz (119.1 kg)   SpO2 97%   BMI 36.22 kg/m    Subjective:    Patient ID: Kenneth Blair, male    DOB: 01/19/1961, 56 y.o.   MRN: [id number]  HPI: Kenneth GoDavid Keith Blair is a 56 y.o. male  Chief Complaint  Patient presents with  . Annual Exam   Hypertension Using medications without difficulty Average home BPs Not checking   No problems or lightheadedness No chest pain with exertion or shortness of breath No Edema  Hyperlipidemia Using medications without problems: No Muscle aches  Diet compliance: Exercise:  Tobacco Not ready to quit smoking  COPD Mild.  Uses inhaler about 1-2 times/week.    Sleep apnea Uses CPAP nightly  Knee Pain   Incident onset: 6 weeks ao. Incident location: stepped down and "came out of place" The pain is present in the left leg. The quality of the pain is described as aching and shooting. The pain is severe. The pain has been constant since onset. Pertinent negatives include no inability to bear weight or muscle weakness. The symptoms are aggravated by weight bearing. He has tried nothing for the symptoms.   Social History   Socioeconomic History  . Marital status: Married    Spouse name: Not on file  . Number of children: Not on file  . Years of education: Not on file  . Highest education level: Not on file  Social Needs  . Financial resource strain: Not on file  . Food insecurity - worry: Not on file  . Food insecurity - inability: Not on file  . Transportation needs - medical: Not on file  . Transportation needs - non-medical: Not on file  Occupational History  . Not on file  Tobacco Use  . Smoking status: Current Every Day Smoker    Packs/day: 1.00    Years: 35.00    Pack years: 35.00    Types: Cigarettes  . Smokeless tobacco: Never Used  Substance and Sexual Activity  . Alcohol use: Yes    Alcohol/week: 1.8 oz    Types: 3 Cans of  beer per week    Comment: weekends  . Drug use: No  . Sexual activity: Yes  Other Topics Concern  . Not on file  Social History Narrative  . Not on file   Relevant past medical, surgical, family and social history reviewed and updated as indicated. Interim medical history since our last visit reviewed. Allergies and medications reviewed and updated.  Review of Systems  Constitutional: Negative.   HENT: Negative.   Eyes: Negative.   Cardiovascular: Negative.   Gastrointestinal: Negative.   Endocrine: Negative.   Genitourinary: Negative.   Skin: Negative.   Allergic/Immunologic: Negative.   Neurological: Negative.   Hematological: Negative.   Psychiatric/Behavioral: Negative.    Per HPI unless specifically indicated above     Objective:    BP 132/76   Pulse 60   Temp 98 F (36.7 C) (Oral)   Ht 5' 11.4" (1.814 m)   Wt 262 lb 9.6 oz (119.1 kg)   SpO2 97%   BMI 36.22 kg/m   Wt Readings from Last 3 Encounters:  01/28/17 262 lb 9.6 oz (119.1 kg)  01/01/17 259 lb (117.5 kg)  04/23/16 262 lb 1.6 oz (118.9 kg)    Physical Exam  Constitutional: He is oriented to  person, place, and time. He appears well-developed and well-nourished.  HENT:  Head: Normocephalic.  Right Ear: Tympanic membrane, external ear and ear canal normal.  Left Ear: Tympanic membrane, external ear and ear canal normal.  Mouth/Throat: Uvula is midline, oropharynx is clear and moist and mucous membranes are normal.  Eyes: Pupils are equal, round, and reactive to light.  Cardiovascular: Normal rate, regular rhythm and normal heart sounds. Exam reveals no gallop and no friction rub.  No murmur heard. Pulmonary/Chest: Effort normal and breath sounds normal. No respiratory distress.  Abdominal: Soft. Bowel sounds are normal. He exhibits no distension. There is no tenderness.  Musculoskeletal: Normal range of motion.  Neurological: He is alert and oriented to person, place, and time. He has normal reflexes.   Skin: Skin is warm and dry.  Psychiatric: He has a normal mood and affect. His behavior is normal. Judgment and thought content normal.     Assessment & Plan:   Problem List Items Addressed This Visit      Unprioritized   COPD, mild (HCC)    Stable, continue present medications.        Hyperlipidemia    Check Lipid panel      Hypertension    Stable, continue present medications.        Knee pain    New in the last 6 weeks.  Will get x-ray.  Recheck next week for a knee injection if appropriate      Relevant Orders   DG Knee Complete 4 Views Left   Sleep apnea    Stable, continue CPAP       Tobacco abuse     I have recommended absolute tobacco cessation. I have discussed various options available for assistance with tobacco cessation including over the counter methods (Nicotine gum, patch and lozenges). We also discussed prescription options (Chantix, Nicotine Inhaler / Nasal Spray). The patient is not interested in pursuing any prescription tobacco cessation options at this time.        Other Visit Diagnoses    Need for influenza vaccination    -  Primary   Relevant Orders   Flu Vaccine QUAD 36+ mos IM (Completed)   Annual physical exam       Relevant Orders   CBC with Differential/Platelet   Comprehensive metabolic panel   Lipid Panel w/o Chol/HDL Ratio   PSA   TSH       Follow up plan: Return for next week for knee injection+++++++++++++++++++++++++++++++++++++++++ .

## 2017-01-28 NOTE — Assessment & Plan Note (Signed)
I have recommended absolute tobacco cessation. I have discussed various options available for assistance with tobacco cessation including over the counter methods (Nicotine gum, patch and lozenges). We also discussed prescription options (Chantix, Nicotine Inhaler / Nasal Spray). The patient is not interested in pursuing any prescription tobacco cessation options at this time.  

## 2017-01-28 NOTE — Assessment & Plan Note (Signed)
Stable, continue CPAP

## 2017-01-28 NOTE — Assessment & Plan Note (Signed)
Stable, continue present medications.   

## 2017-01-28 NOTE — Patient Instructions (Signed)

## 2017-01-28 NOTE — Assessment & Plan Note (Addendum)
New in the last 6 weeks.  Will get x-ray.  Recheck next week for a knee injection if appropriate

## 2017-01-28 NOTE — Assessment & Plan Note (Signed)
Check Lipid panel 

## 2017-01-29 LAB — CBC WITH DIFFERENTIAL/PLATELET
Basophils Absolute: 0 10*3/uL (ref 0.0–0.2)
Basos: 0 %
EOS (ABSOLUTE): 0.2 10*3/uL (ref 0.0–0.4)
Eos: 3 %
Hematocrit: 41.5 % (ref 37.5–51.0)
Hemoglobin: 14.2 g/dL (ref 13.0–17.7)
Immature Grans (Abs): 0 10*3/uL (ref 0.0–0.1)
Immature Granulocytes: 0 %
Lymphocytes Absolute: 2.1 10*3/uL (ref 0.7–3.1)
Lymphs: 29 %
MCH: 32.6 pg (ref 26.6–33.0)
MCHC: 34.2 g/dL (ref 31.5–35.7)
MCV: 95 fL (ref 79–97)
Monocytes Absolute: 0.6 10*3/uL (ref 0.1–0.9)
Monocytes: 8 %
Neutrophils Absolute: 4.4 10*3/uL (ref 1.4–7.0)
Neutrophils: 60 %
Platelets: 206 10*3/uL (ref 150–379)
RBC: 4.35 x10E6/uL (ref 4.14–5.80)
RDW: 13.6 % (ref 12.3–15.4)
WBC: 7.3 10*3/uL (ref 3.4–10.8)

## 2017-01-29 LAB — COMPREHENSIVE METABOLIC PANEL
ALT: 10 IU/L (ref 0–44)
AST: 14 IU/L (ref 0–40)
Albumin/Globulin Ratio: 2 (ref 1.2–2.2)
Albumin: 4.3 g/dL (ref 3.5–5.5)
Alkaline Phosphatase: 78 IU/L (ref 39–117)
BUN/Creatinine Ratio: 18 (ref 9–20)
BUN: 18 mg/dL (ref 6–24)
Bilirubin Total: <0.2 mg/dL (ref 0.0–1.2)
CO2: 22 mmol/L (ref 20–29)
Calcium: 9.5 mg/dL (ref 8.7–10.2)
Chloride: 106 mmol/L (ref 96–106)
Creatinine, Ser: 1 mg/dL (ref 0.76–1.27)
GFR calc Af Amer: 97 mL/min/{1.73_m2} (ref >59)
GFR calc non Af Amer: 84 mL/min/{1.73_m2} (ref >59)
Globulin, Total: 2.1 g/dL (ref 1.5–4.5)
Glucose: 83 mg/dL (ref 65–99)
Potassium: 3.9 mmol/L (ref 3.5–5.2)
Sodium: 142 mmol/L (ref 134–144)
Total Protein: 6.4 g/dL (ref 6.0–8.5)

## 2017-01-29 LAB — LIPID PANEL W/O CHOL/HDL RATIO
Cholesterol, Total: 174 mg/dL (ref 100–199)
HDL: 33 mg/dL — ABNORMAL LOW (ref >39)
LDL Calculated: 109 mg/dL — ABNORMAL HIGH (ref 0–99)
Triglycerides: 158 mg/dL — ABNORMAL HIGH (ref 0–149)
VLDL Cholesterol Cal: 32 mg/dL (ref 5–40)

## 2017-01-29 LAB — PSA: Prostate Specific Ag, Serum: 0.4 ng/mL (ref 0.0–4.0)

## 2017-01-29 LAB — TSH: TSH: 1.76 u[IU]/mL (ref 0.450–4.500)

## 2017-01-31 ENCOUNTER — Ambulatory Visit
Admission: RE | Admit: 2017-01-31 | Discharge: 2017-01-31 | Disposition: A | Discharge status: Home / Self Care | Payer: BLUE CROSS/BLUE SHIELD | Source: Ambulatory Visit | Attending: Unknown Physician Specialty | Admitting: Unknown Physician Specialty

## 2017-01-31 ENCOUNTER — Encounter: Payer: Self-pay | Admitting: Unknown Physician Specialty

## 2017-01-31 DIAGNOSIS — M25562 Pain in left knee: Secondary | ICD-10-CM

## 2017-01-31 DIAGNOSIS — M1712 Unilateral primary osteoarthritis, left knee: Secondary | ICD-10-CM | POA: Insufficient documentation

## 2017-01-31 DIAGNOSIS — M179 Osteoarthritis of knee, unspecified: Secondary | ICD-10-CM | POA: Diagnosis not present

## 2017-02-02 ENCOUNTER — Ambulatory Visit: Payer: BLUE CROSS/BLUE SHIELD | Admitting: Unknown Physician Specialty

## 2017-02-02 ENCOUNTER — Encounter: Payer: Self-pay | Admitting: Unknown Physician Specialty

## 2017-02-02 DIAGNOSIS — M25562 Pain in left knee: Secondary | ICD-10-CM

## 2017-02-02 MED ORDER — TRIAMCINOLONE ACETONIDE 40 MG/ML IJ SUSP
40.0000 mg | Freq: Once | INTRAMUSCULAR | Status: AC
  Administered 2017-02-02: 40 mg via INTRAMUSCULAR

## 2017-02-02 NOTE — Progress Notes (Signed)
BP 123/81   Pulse 73   Temp 98.4 F (36.9 C) (Oral)   Wt 259 lb (117.5 kg)   SpO2 98%   BMI 35.72 kg/m    Subjective:    Patient ID: Kenneth Blair, male    DOB: 03/17/1960, 56 y.o.   MRN: [id number]  HPI: Kenneth GoDavid Keith Barga is a 56 y.o. male  Chief Complaint  Patient presents with  . Knee Injection   Knee OA Pt with left knee swelling.  Similar to right knee in which in the past, knee injections helped.  X-ray reviewed showing medial OA.    Relevant past medical, surgical, family and social history reviewed and updated as indicated. Interim medical history since our last visit reviewed. Allergies and medications reviewed and updated.  Review of Systems  Per HPI unless specifically indicated above     Objective:    BP 123/81   Pulse 73   Temp 98.4 F (36.9 C) (Oral)   Wt 259 lb (117.5 kg)   SpO2 98%   BMI 35.72 kg/m   Wt Readings from Last 3 Encounters:  02/02/17 259 lb (117.5 kg)  01/28/17 262 lb 9.6 oz (119.1 kg)  01/01/17 259 lb (117.5 kg)    Physical Exam  Constitutional: He is oriented to person, place, and time. He appears well-developed and well-nourished. No distress.  HENT:  Head: Normocephalic and atraumatic.  Eyes: Conjunctivae and lids are normal. Right eye exhibits no discharge. Left eye exhibits no discharge. No scleral icterus.  Cardiovascular: Normal rate.  Pulmonary/Chest: Effort normal.  Abdominal: Normal appearance. There is no splenomegaly or hepatomegaly.  Musculoskeletal: Normal range of motion.  Neurological: He is alert and oriented to person, place, and time.  Skin: Skin is intact. No rash noted. No pallor.  Psychiatric: He has a normal mood and affect. His behavior is normal. Judgment and thought content normal.   STEROID INJECTION  Procedure: Knee Intraarticular Steroid Injection   Description: After verbal consent and patient ed on Area prepped and draped using  semi-sterile technique. Using a anterior  approach, a mixture  of 4 cc of  1% Marcaine & 1 cc of Kenalog 40 was injected into knee joint.  A bandage was then placed over the injection site. Complications:  none Post Procedure Instructions: To the ER if any symptoms of erythema or swelling.   Follow Up: PRN   Results for orders placed or performed in visit on 01/28/17  CBC with Differential/Platelet  Result Value Ref Range   WBC 7.3 3.4 - 10.8 x10E3/uL   RBC 4.35 4.14 - 5.80 x10E6/uL   Hemoglobin 14.2 13.0 - 17.7 g/dL   Hematocrit 40.941.5 81.137.5 - 51.0 %   MCV 95 79 - 97 fL   MCH 32.6 26.6 - 33.0 pg   MCHC 34.2 31.5 - 35.7 g/dL   RDW 91.413.6 78.212.3 - 95.615.4 %   Platelets 206 150 - 379 x10E3/uL   Neutrophils 60 Not Estab. %   Lymphs 29 Not Estab. %   Monocytes 8 Not Estab. %   Eos 3 Not Estab. %   Basos 0 Not Estab. %   Neutrophils Absolute 4.4 1.4 - 7.0 x10E3/uL   Lymphocytes Absolute 2.1 0.7 - 3.1 x10E3/uL   Monocytes Absolute 0.6 0.1 - 0.9 x10E3/uL   EOS (ABSOLUTE) 0.2 0.0 - 0.4 x10E3/uL   Basophils Absolute 0.0 0.0 - 0.2 x10E3/uL   Immature Granulocytes 0 Not Estab. %   Immature Grans (Abs) 0.0 0.0 - 0.1 x10E3/uL  Comprehensive metabolic panel  Result Value Ref Range   Glucose 83 65 - 99 mg/dL   BUN 18 6 - 24 mg/dL   Creatinine, Ser 4.091.00 0.76 - 1.27 mg/dL   GFR calc non Af Amer 84 >59 mL/min/1.73   GFR calc Af Amer 97 >59 mL/min/1.73   BUN/Creatinine Ratio 18 9 - 20   Sodium 142 134 - 144 mmol/L   Potassium 3.9 3.5 - 5.2 mmol/L   Chloride 106 96 - 106 mmol/L   CO2 22 20 - 29 mmol/L   Calcium 9.5 8.7 - 10.2 mg/dL   Total Protein 6.4 6.0 - 8.5 g/dL   Albumin 4.3 3.5 - 5.5 g/dL   Globulin, Total 2.1 1.5 - 4.5 g/dL   Albumin/Globulin Ratio 2.0 1.2 - 2.2   Bilirubin Total <0.2 0.0 - 1.2 mg/dL   Alkaline Phosphatase 78 39 - 117 IU/L   AST 14 0 - 40 IU/L   ALT 10 0 - 44 IU/L  Lipid Panel w/o Chol/HDL Ratio  Result Value Ref Range   Cholesterol, Total 174 100 - 199 mg/dL   Triglycerides 811158 (H) 0 - 149 mg/dL   HDL 33 (L) >91>39 mg/dL   VLDL  Cholesterol Cal 32 5 - 40 mg/dL   LDL Calculated 478109 (H) 0 - 99 mg/dL  PSA  Result Value Ref Range   Prostate Specific Ag, Serum 0.4 0.0 - 4.0 ng/mL  TSH  Result Value Ref Range   TSH 1.760 0.450 - 4.500 uIU/mL      Assessment & Plan:   Problem List Items Addressed This Visit      Unprioritized   Knee pain    Injection as above          Follow up plan: Return if symptoms worsen or fail to improve.

## 2017-02-02 NOTE — Assessment & Plan Note (Signed)
Injection as above 

## 2017-02-11 ENCOUNTER — Other Ambulatory Visit: Payer: Self-pay | Admitting: Unknown Physician Specialty

## 2017-02-11 DIAGNOSIS — J449 Chronic obstructive pulmonary disease, unspecified: Secondary | ICD-10-CM

## 2017-02-11 MED ORDER — ALBUTEROL SULFATE HFA 108 (90 BASE) MCG/ACT IN AERS: 2.0000 | INHALATION_SPRAY | RESPIRATORY_TRACT | 0 refills | Status: DC | PRN

## 2017-03-10 DIAGNOSIS — G4733 Obstructive sleep apnea (adult) (pediatric): Secondary | ICD-10-CM | POA: Diagnosis not present

## 2017-03-14 DIAGNOSIS — G4733 Obstructive sleep apnea (adult) (pediatric): Secondary | ICD-10-CM | POA: Diagnosis not present

## 2017-04-05 ENCOUNTER — Encounter: Payer: Self-pay | Admitting: Unknown Physician Specialty

## 2017-04-06 ENCOUNTER — Ambulatory Visit (INDEPENDENT_AMBULATORY_CARE_PROVIDER_SITE_OTHER): Payer: Self-pay | Admitting: Unknown Physician Specialty

## 2017-04-06 ENCOUNTER — Encounter: Payer: Self-pay | Admitting: Unknown Physician Specialty

## 2017-04-06 VITALS — BP 137/77 | HR 63 | Temp 98.6°F | Ht 71.0 in | Wt 259.8 lb

## 2017-04-06 DIAGNOSIS — Z024 Encounter for examination for driving license: Secondary | ICD-10-CM

## 2017-04-06 NOTE — Progress Notes (Signed)
BP 137/77   Pulse 63   Temp 98.6 F (37 C) (Oral)   Ht 5\' 11"  (1.803 m)   Wt 259 lb 12.8 oz (117.8 kg)   SpO2 98%   BMI 36.23 kg/m    Subjective:    Patient ID: Kenneth Blair, male    DOB: 08/15/1960, 57 y.o.   MRN: [id number]  HPI: Kenneth Blair is a 57 y.o. male  Chief Complaint  Patient presents with  . DOT Physical   Pt is here for a DOT physical.  He is on a yearly evaluation status de to Hypertension and sleep apnea.    Relevant past medical, surgical, family and social history reviewed and updated as indicated. Interim medical history since our last visit reviewed. Allergies and medications reviewed and updated.  Review of Systems  Per HPI unless specifically indicated above     Objective:    BP 137/77   Pulse 63   Temp 98.6 F (37 C) (Oral)   Ht 5\' 11"  (1.803 m)   Wt 259 lb 12.8 oz (117.8 kg)   SpO2 98%   BMI 36.23 kg/m   Wt Readings from Last 3 Encounters:  04/06/17 259 lb 12.8 oz (117.8 kg)  02/02/17 259 lb (117.5 kg)  01/28/17 262 lb 9.6 oz (119.1 kg)    Physical Exam  See DOT Results for orders placed or performed in visit on 01/28/17  CBC with Differential/Platelet  Result Value Ref Range   WBC 7.3 3.4 - 10.8 x10E3/uL   RBC 4.35 4.14 - 5.80 x10E6/uL   Hemoglobin 14.2 13.0 - 17.7 g/dL   Hematocrit 11.941.5 14.737.5 - 51.0 %   MCV 95 79 - 97 fL   MCH 32.6 26.6 - 33.0 pg   MCHC 34.2 31.5 - 35.7 g/dL   RDW 82.913.6 56.212.3 - 13.015.4 %   Platelets 206 150 - 379 x10E3/uL   Neutrophils 60 Not Estab. %   Lymphs 29 Not Estab. %   Monocytes 8 Not Estab. %   Eos 3 Not Estab. %   Basos 0 Not Estab. %   Neutrophils Absolute 4.4 1.4 - 7.0 x10E3/uL   Lymphocytes Absolute 2.1 0.7 - 3.1 x10E3/uL   Monocytes Absolute 0.6 0.1 - 0.9 x10E3/uL   EOS (ABSOLUTE) 0.2 0.0 - 0.4 x10E3/uL   Basophils Absolute 0.0 0.0 - 0.2 x10E3/uL   Immature Granulocytes 0 Not Estab. %   Immature Grans (Abs) 0.0 0.0 - 0.1 x10E3/uL  Comprehensive metabolic panel  Result Value Ref  Range   Glucose 83 65 - 99 mg/dL   BUN 18 6 - 24 mg/dL   Creatinine, Ser 8.651.00 0.76 - 1.27 mg/dL   GFR calc non Af Amer 84 >59 mL/min/1.73   GFR calc Af Amer 97 >59 mL/min/1.73   BUN/Creatinine Ratio 18 9 - 20   Sodium 142 134 - 144 mmol/L   Potassium 3.9 3.5 - 5.2 mmol/L   Chloride 106 96 - 106 mmol/L   CO2 22 20 - 29 mmol/L   Calcium 9.5 8.7 - 10.2 mg/dL   Total Protein 6.4 6.0 - 8.5 g/dL   Albumin 4.3 3.5 - 5.5 g/dL   Globulin, Total 2.1 1.5 - 4.5 g/dL   Albumin/Globulin Ratio 2.0 1.2 - 2.2   Bilirubin Total <0.2 0.0 - 1.2 mg/dL   Alkaline Phosphatase 78 39 - 117 IU/L   AST 14 0 - 40 IU/L   ALT 10 0 - 44 IU/L  Lipid Panel w/o Chol/HDL Ratio  Result Value Ref Range   Cholesterol, Total 174 100 - 199 mg/dL   Triglycerides 409 (H) 0 - 149 mg/dL   HDL 33 (L) >81 mg/dL   VLDL Cholesterol Cal 32 5 - 40 mg/dL   LDL Calculated 191 (H) 0 - 99 mg/dL  PSA  Result Value Ref Range   Prostate Specific Ag, Serum 0.4 0.0 - 4.0 ng/mL  TSH  Result Value Ref Range   TSH 1.760 0.450 - 4.500 uIU/mL      Assessment & Plan:   Problem List Items Addressed This Visit    None    Visit Diagnoses    Encounter for commercial driver medical examination (CDME)    -  Primary      See DOT form  Follow up plan: No Follow-up on file.

## 2017-04-10 ENCOUNTER — Other Ambulatory Visit: Payer: Self-pay | Admitting: Unknown Physician Specialty

## 2017-04-10 DIAGNOSIS — I10 Essential (primary) hypertension: Secondary | ICD-10-CM

## 2017-05-07 ENCOUNTER — Other Ambulatory Visit: Payer: Self-pay | Admitting: Unknown Physician Specialty

## 2017-08-23 DIAGNOSIS — G4733 Obstructive sleep apnea (adult) (pediatric): Secondary | ICD-10-CM | POA: Diagnosis not present

## 2017-09-28 ENCOUNTER — Other Ambulatory Visit: Payer: Self-pay | Admitting: Unknown Physician Specialty

## 2017-09-28 DIAGNOSIS — J449 Chronic obstructive pulmonary disease, unspecified: Secondary | ICD-10-CM

## 2017-09-29 NOTE — Telephone Encounter (Signed)
LOV 04/06/17 Kenneth Blair

## 2017-10-04 ENCOUNTER — Other Ambulatory Visit: Payer: Self-pay | Admitting: Unknown Physician Specialty

## 2017-10-04 DIAGNOSIS — I10 Essential (primary) hypertension: Secondary | ICD-10-CM

## 2017-10-25 ENCOUNTER — Other Ambulatory Visit: Payer: Self-pay | Admitting: Family Medicine

## 2017-10-25 ENCOUNTER — Other Ambulatory Visit: Payer: Self-pay | Admitting: *Deleted

## 2017-10-25 DIAGNOSIS — J449 Chronic obstructive pulmonary disease, unspecified: Secondary | ICD-10-CM

## 2017-11-02 ENCOUNTER — Other Ambulatory Visit: Payer: Self-pay | Admitting: Unknown Physician Specialty

## 2017-11-02 ENCOUNTER — Other Ambulatory Visit: Payer: Self-pay

## 2017-11-02 MED ORDER — AMLODIPINE BESYLATE 10 MG PO TABS: 10.0000 mg | ORAL_TABLET | Freq: Every day | ORAL | 1 refills | Status: DC

## 2017-11-21 ENCOUNTER — Other Ambulatory Visit: Payer: Self-pay | Admitting: Physician Assistant

## 2017-11-21 DIAGNOSIS — J449 Chronic obstructive pulmonary disease, unspecified: Secondary | ICD-10-CM

## 2017-12-22 DIAGNOSIS — G4733 Obstructive sleep apnea (adult) (pediatric): Secondary | ICD-10-CM | POA: Diagnosis not present

## 2017-12-28 ENCOUNTER — Other Ambulatory Visit: Payer: Self-pay | Admitting: Physician Assistant

## 2017-12-28 DIAGNOSIS — I10 Essential (primary) hypertension: Secondary | ICD-10-CM

## 2017-12-28 NOTE — Telephone Encounter (Signed)
Refill request approved for medication.

## 2018-02-09 ENCOUNTER — Ambulatory Visit: Payer: BLUE CROSS/BLUE SHIELD | Admitting: Family Medicine

## 2018-02-09 ENCOUNTER — Encounter: Payer: Self-pay | Admitting: Family Medicine

## 2018-02-09 ENCOUNTER — Other Ambulatory Visit: Payer: Self-pay

## 2018-02-09 VITALS — BP 130/74 | HR 70 | Temp 98.3°F | Ht 72.0 in | Wt 265.0 lb

## 2018-02-09 DIAGNOSIS — J441 Chronic obstructive pulmonary disease with (acute) exacerbation: Secondary | ICD-10-CM | POA: Diagnosis not present

## 2018-02-09 DIAGNOSIS — R52 Pain, unspecified: Secondary | ICD-10-CM

## 2018-02-09 LAB — VERITOR FLU A/B WAIVED
Influenza A: NEGATIVE
Influenza B: NEGATIVE

## 2018-02-09 MED ORDER — AZITHROMYCIN 250 MG PO TABS: ORAL_TABLET | ORAL | 0 refills | Status: DC

## 2018-02-09 MED ORDER — BENZONATATE 200 MG PO CAPS: 200.0000 mg | ORAL_CAPSULE | Freq: Two times a day (BID) | ORAL | 0 refills | Status: DC | PRN

## 2018-02-09 MED ORDER — PREDNISONE 10 MG PO TABS: ORAL_TABLET | ORAL | 0 refills | Status: DC

## 2018-02-09 NOTE — Progress Notes (Signed)
BP 130/74   Pulse 70   Temp 98.3 F (36.8 C) (Oral)   Ht 6' (1.829 m)   Wt 265 lb (120.2 kg)   SpO2 96%   BMI 35.94 kg/m    Subjective:    Patient ID: Kenneth Blair, male    DOB: 1960-07-13, 57 y.o.   MRN: [id number]  HPI: Kenneth Blair is a 57 y.o. male  Chief Complaint  Patient presents with  . Cough    body aches since yesterday/ pt states has taken OTC nyquil  . Chest congestion  . 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 Interim medical history since our last visit reviewed. Allergies and medications reviewed and updated.  Review of Systems  Constitutional: Positive for fatigue. Negative for appetite change, chills, diaphoresis, fever and unexpected weight change.  HENT: Positive for congestion, postnasal drip, rhinorrhea and sore throat. Negative for dental problem, drooling, ear discharge, ear pain, facial swelling, hearing loss, mouth sores, nosebleeds, sinus pressure, sinus pain, sneezing, tinnitus, trouble swallowing and voice change.   Respiratory: Positive for cough, chest tightness, shortness of breath and wheezing. Negative for apnea, choking and stridor.   Cardiovascular: Positive for chest  pain. Negative for palpitations and leg swelling.  Musculoskeletal: Negative.   Psychiatric/Behavioral: Negative.     Per HPI unless specifically indicated above     Objective:    BP 130/74   Pulse 70   Temp 98.3 F (36.8 C) (Oral)   Ht 6' (1.829 m)   Wt 265 lb (120.2 kg)   SpO2 96%   BMI 35.94 kg/m   Wt Readings from Last 3 Encounters:  02/09/18 265 lb (120.2 kg)  04/06/17 259 lb 12.8 oz (117.8 kg)  02/02/17 259 lb (117.5 kg)    Physical Exam Constitutional:      General: He is not in acute distress.    Appearance: He is well-developed. He is obese.  HENT:     Head: Normocephalic and atraumatic.     Right Ear: Hearing, tympanic membrane, ear canal and external ear normal. There is no impacted cerumen.     Left Ear: Hearing, tympanic membrane, ear canal and external ear normal. There is no impacted cerumen.     Nose: Nose normal. No congestion or rhinorrhea.     Mouth/Throat:     Mouth: Mucous membranes are moist.     Pharynx: Oropharynx is clear. No oropharyngeal exudate or posterior oropharyngeal erythema.  Eyes:     General: Lids are normal. No scleral icterus.       Right eye: No discharge.        Left eye: No discharge.     Extraocular Movements: Extraocular movements intact.     Conjunctiva/sclera: Conjunctivae normal.     Pupils: Pupils are equal, round, and reactive to  light.  Neck:     Musculoskeletal: Normal range of motion and neck supple. No neck rigidity.     Vascular: No carotid bruit.  Cardiovascular:     Rate and Rhythm: Normal rate and regular rhythm.     Pulses: Normal pulses.     Heart sounds: No murmur. No friction rub. No gallop.   Pulmonary:     Effort: Pulmonary effort is normal. No respiratory distress.     Breath sounds: No stridor. Wheezing and rhonchi present. No rales.  Chest:     Chest wall: No tenderness.  Musculoskeletal: Normal range of motion.  Lymphadenopathy:     Cervical: No cervical adenopathy.  Skin:    General: Skin is  warm and dry.     Capillary Refill: Capillary refill takes less than 2 seconds.     Coloration: Skin is not jaundiced or pale.     Findings: No bruising, erythema, lesion or rash.  Neurological:     General: No focal deficit present.     Mental Status: He is alert and oriented to person, place, and time.  Psychiatric:        Mood and Affect: Mood normal.        Speech: Speech normal.        Behavior: Behavior normal.        Thought Content: Thought content normal.        Judgment: Judgment normal.     Results for orders placed or performed in visit on 01/28/17  CBC with Differential/Platelet  Result Value Ref Range   WBC 7.3 3.4 - 10.8 x10E3/uL   RBC 4.35 4.14 - 5.80 x10E6/uL   Hemoglobin 14.2 13.0 - 17.7 g/dL   Hematocrit 81.1 91.4 - 51.0 %   MCV 95 79 - 97 fL   MCH 32.6 26.6 - 33.0 pg   MCHC 34.2 31.5 - 35.7 g/dL   RDW 78.2 95.6 - 21.3 %   Platelets 206 150 - 379 x10E3/uL   Neutrophils 60 Not Estab. %   Lymphs 29 Not Estab. %   Monocytes 8 Not Estab. %   Eos 3 Not Estab. %   Basos 0 Not Estab. %   Neutrophils Absolute 4.4 1.4 - 7.0 x10E3/uL   Lymphocytes Absolute 2.1 0.7 - 3.1 x10E3/uL   Monocytes Absolute 0.6 0.1 - 0.9 x10E3/uL   EOS (ABSOLUTE) 0.2 0.0 - 0.4 x10E3/uL   Basophils Absolute 0.0 0.0 - 0.2 x10E3/uL   Immature Granulocytes 0 Not Estab. %   Immature Grans (Abs) 0.0 0.0 - 0.1 x10E3/uL  Comprehensive metabolic panel  Result Value Ref Range   Glucose 83 65 - 99 mg/dL   BUN 18 6 - 24 mg/dL   Creatinine, Ser 0.86 0.76 - 1.27 mg/dL   GFR calc non Af Amer 84 >59 mL/min/1.73   GFR calc Af Amer 97 >59 mL/min/1.73   BUN/Creatinine Ratio 18 9 - 20   Sodium 142 134 - 144 mmol/L   Potassium 3.9 3.5 - 5.2 mmol/L   Chloride 106 96 - 106 mmol/L   CO2 22 20 - 29 mmol/L   Calcium 9.5 8.7 - 10.2 mg/dL   Total Protein 6.4 6.0 - 8.5 g/dL   Albumin 4.3 3.5 - 5.5 g/dL   Globulin, Total 2.1 1.5 - 4.5 g/dL   Albumin/Globulin Ratio 2.0 1.2 - 2.2   Bilirubin Total <0.2 0.0  - 1.2 mg/dL   Alkaline Phosphatase 78 39 - 117 IU/L   AST 14 0 - 40 IU/L  ALT 10 0 - 44 IU/L  Lipid Panel w/o Chol/HDL Ratio  Result Value Ref Range   Cholesterol, Total 174 100 - 199 mg/dL   Triglycerides 161158 (H) 0 - 149 mg/dL   HDL 33 (L) >09>39 mg/dL   VLDL Cholesterol Cal 32 5 - 40 mg/dL   LDL Calculated 604109 (H) 0 - 99 mg/dL  PSA  Result Value Ref Range   Prostate Specific Ag, Serum 0.4 0.0 - 4.0 ng/mL  TSH  Result Value Ref Range   TSH 1.760 0.450 - 4.500 uIU/mL      Assessment & Plan:   Problem List Items Addressed This Visit      Respiratory   COPD exacerbation (HCC) - Primary    Will treat with prednisone taper and azithromycin and tessalon. Recheck 2 weeks. Call with any concerns. Cleda DaubSpiro next visit. Needs preventative inhaler.       Relevant Medications   predniSONE (DELTASONE) 10 MG tablet   azithromycin (ZITHROMAX) 250 MG tablet   benzonatate (TESSALON) 200 MG capsule    Other Visit Diagnoses    Body aches       Flu negative.    Relevant Orders   Veritor Flu A/B Waived       Follow up plan: Return in about 2 weeks (around 02/23/2018) for lung recheck with spiro.

## 2018-02-09 NOTE — Assessment & Plan Note (Signed)
Will treat with prednisone taper and azithromycin and tessalon. Recheck 2 weeks. Call with any concerns. Cleda DaubSpiro next visit. Needs preventative inhaler.

## 2018-02-14 ENCOUNTER — Ambulatory Visit: Payer: Self-pay

## 2018-02-14 NOTE — Telephone Encounter (Signed)
Call placed to patient who states he has just been in the office last week for cough and congestion.  His cough is non productive.  He has completed his Zpac yesterday.  He is taking prednisone. He is using tessalon for cough suppression as prescribed. Pt states he is still rattling when he inhales. He is having trouble sleeping because of his breathing He Uses CPAP and it is not helping.  He states that he is better than he was last week but is missing a lot of sleep. He denies fever. His nose is stuffy. Pt was advised that Zpac continues to work even after the last dose. Per protocol pt should be re evaluated in 24 hours. No appointment are available. Pt refuses urgent care. Appointment made at pt request for Friday. Pt states that if his symptoms worsen her will go to urgent care. Care advice read to patient. Pt verbalized understanding of all instructions.  Reason for Disposition . [1] Continuous (nonstop) coughing interferes with work or school AND [2] no improvement using cough treatment per protocol  Answer Assessment - Initial Assessment Questions 1. ONSET: "When did the cough begin?"      Last Thursday seen in office 2. SEVERITY: "How bad is the cough today?"      Keep up at night 3. RESPIRATORY DISTRESS: "Describe your breathing."      rattles 4. FEVER: "Do you have a fever?" If so, ask: "What is your temperature, how was it measured, and when did it start?"     no 5. HEMOPTYSIS: "Are you coughing up any blood?" If so ask: "How much?" (flecks, streaks, tablespoons, etc.)     no 6. TREATMENT: "What have you done so far to treat the cough?" (e.g., meds, fluids, humidifier)     prednisone, inhalers, tessalon 7. CARDIAC HISTORY: "Do you have any history of heart disease?" (e.g., heart attack, congestive heart failure)      No HTN 8. LUNG HISTORY: "Do you have any history of lung disease?"  (e.g., pulmonary embolus, asthma, emphysema)     pneumonia 9. PE RISK FACTORS: "Do you have a  history of blood clots?" (or: recent major surgery, recent prolonged travel, bedridden)    no 10. OTHER SYMPTOMS: "Do you have any other symptoms? (e.g., runny nose, wheezing, chest pain)       Stuffy nose 11. PREGNANCY: "Is there any chance you are pregnant?" "When was your last menstrual period?"       NO 12. TRAVEL: "Have you traveled out of the country in the last month?" (e.g., travel history, exposures)       No  Protocols used: COUGH - ACUTE NON-PRODUCTIVE-A-AH

## 2018-02-17 ENCOUNTER — Encounter: Payer: Self-pay | Admitting: Nurse Practitioner

## 2018-02-17 ENCOUNTER — Ambulatory Visit: Payer: BLUE CROSS/BLUE SHIELD | Admitting: Nurse Practitioner

## 2018-02-17 ENCOUNTER — Ambulatory Visit
Admission: RE | Admit: 2018-02-17 | Discharge: 2018-02-17 | Disposition: A | Discharge status: Home / Self Care | Payer: BLUE CROSS/BLUE SHIELD | Source: Ambulatory Visit | Attending: Nurse Practitioner | Admitting: Nurse Practitioner

## 2018-02-17 VITALS — BP 125/75 | HR 58 | Temp 98.3°F | Ht 72.0 in | Wt 268.2 lb

## 2018-02-17 DIAGNOSIS — R05 Cough: Secondary | ICD-10-CM

## 2018-02-17 DIAGNOSIS — R0602 Shortness of breath: Secondary | ICD-10-CM | POA: Diagnosis not present

## 2018-02-17 DIAGNOSIS — R059 Cough, unspecified: Secondary | ICD-10-CM

## 2018-02-17 MED ORDER — FLUTICASONE PROPIONATE 50 MCG/ACT NA SUSP: 2.0000 | Freq: Every day | NASAL | 6 refills | Status: DC

## 2018-02-17 MED ORDER — ALBUTEROL SULFATE (2.5 MG/3ML) 0.083% IN NEBU
2.5000 mg | INHALATION_SOLUTION | Freq: Once | RESPIRATORY_TRACT | Status: AC
  Administered 2018-02-17: 2.5 mg via RESPIRATORY_TRACT

## 2018-02-17 NOTE — Patient Instructions (Signed)
Acute Bronchitis, Adult Acute bronchitis is when air tubes (bronchi) in the lungs suddenly get swollen. The condition can make it hard to breathe. It can also cause these symptoms:  A cough.  Coughing up clear, yellow, or green mucus.  Wheezing.  Chest congestion.  Shortness of breath.  A fever.  Body aches.  Chills.  A sore throat. Follow these instructions at home:  Medicines  Take over-the-counter and prescription medicines only as told by your doctor.  If you were prescribed an antibiotic medicine, take it as told by your doctor. Do not stop taking the antibiotic even if you start to feel better. General instructions  Rest.  Drink enough fluids to keep your pee (urine) pale yellow.  Avoid smoking and secondhand smoke. If you smoke and you need help quitting, ask your doctor. Quitting will help your lungs heal faster.  Use an inhaler, cool mist vaporizer, or humidifier as told by your doctor.  Keep all follow-up visits as told by your doctor. This is important. How is this prevented? To lower your risk of getting this condition again:  Wash your hands often with soap and water. If you cannot use soap and water, use hand sanitizer.  Avoid contact with people who have cold symptoms.  Try not to touch your hands to your mouth, nose, or eyes.  Make sure to get the flu shot every year. Contact a doctor if:  Your symptoms do not get better in 2 weeks. Get help right away if:  You cough up blood.  You have chest pain.  You have very bad shortness of breath.  You become dehydrated.  You faint (pass out) or keep feeling like you are going to pass out.  You keep throwing up (vomiting).  You have a very bad headache.  Your fever or chills gets worse. This information is not intended to replace advice given to you by your health care provider. Make sure you discuss any questions you have with your health care provider. Document Released: 08/04/2007 Document  Revised: 09/29/2016 Document Reviewed: 08/06/2015 Elsevier Interactive Patient Education  2019 Elsevier Inc.  

## 2018-02-17 NOTE — Progress Notes (Signed)
BP 125/75 (BP Location: Left Arm, Patient Position: Sitting, Cuff Size: Normal)   Pulse (!) 58   Temp 98.3 F (36.8 C) (Oral)   Ht 6' (1.829 m)   Wt 268 lb 3.2 oz (121.7 kg)   SpO2 97%   BMI 36.37 kg/m    Subjective:    Patient ID: Kenneth Blair, male    DOB: 08/21/1960, 57 y.o.   MRN: [id number]  HPI: Kenneth GoDavid Keith Mcfarlan is a 57 y.o. male presents for ongoing cough  Chief Complaint  Patient presents with  . Cough    Ongoing for a week. Patient feels better but he's still coughing. Not able to sleep. Has been taking nyqul. Just finished antibiotic.   Marland Kitchen. Nasal Congestion   UPPER RESPIRATORY TRACT INFECTION Was treated 02/09/18 with Zpack and Prednisone. 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 Interim medical history since our last visit reviewed. Allergies and medications reviewed and updated.  Review of Systems  Constitutional: Positive for fatigue. Negative for activity change, diaphoresis and fever.  HENT: Positive for congestion, postnasal drip, rhinorrhea and sinus pressure. Negative for ear discharge, ear pain, facial  swelling, sinus pain, sneezing, sore throat and voice change.   Respiratory: Negative for cough, chest tightness, shortness of breath and wheezing.   Cardiovascular: Negative for chest pain, palpitations and leg swelling.  Gastrointestinal: Negative for abdominal distention, abdominal pain, constipation, diarrhea, nausea and vomiting.  Endocrine: Negative for cold intolerance, heat intolerance, polydipsia, polyphagia and polyuria.  Musculoskeletal: Negative.   Skin: Negative.   Neurological: Negative for dizziness, syncope, weakness, light-headedness, numbness and headaches.  Psychiatric/Behavioral: Negative.     Per HPI unless specifically indicated above     Objective:    BP 125/75 (BP Location: Left Arm, Patient Position: Sitting, Cuff Size: Normal)   Pulse (!) 58   Temp 98.3 F (36.8 C) (Oral)   Ht 6' (1.829 m)   Wt 268 lb 3.2 oz (121.7 kg)   SpO2 97%   BMI 36.37 kg/m   Wt Readings from Last 3 Encounters:  02/17/18 268 lb 3.2 oz (121.7 kg)  02/09/18 265 lb (120.2 kg)  04/06/17 259 lb 12.8 oz (117.8 kg)    Physical Exam Vitals signs and nursing note reviewed.  Constitutional:      Appearance: He is well-developed. He is obese.  HENT:     Head: Normocephalic and atraumatic.     Right Ear: Hearing, tympanic membrane, ear canal and external ear normal. No drainage.     Left Ear: Hearing, tympanic membrane, ear canal and external ear normal. No drainage.     Nose: Mucosal edema and rhinorrhea present. Rhinorrhea is clear.     Right Sinus: No maxillary sinus tenderness or frontal sinus  tenderness.     Left Sinus: No maxillary sinus tenderness or frontal sinus tenderness.     Mouth/Throat:     Pharynx: Oropharynx is clear. Uvula midline. No oropharyngeal exudate or posterior oropharyngeal erythema.  Eyes:     General: Lids are normal.        Right eye: No discharge.        Left eye: No discharge.     Conjunctiva/sclera: Conjunctivae normal.     Pupils: Pupils are equal,  round, and reactive to light.  Neck:     Musculoskeletal: Normal range of motion and neck supple.     Thyroid: No thyromegaly.     Vascular: No carotid bruit or JVD.     Trachea: Trachea normal.  Cardiovascular:     Rate and Rhythm: Normal rate and regular rhythm.     Heart sounds: Normal heart sounds, S1 normal and S2 normal. No murmur. No gallop.   Pulmonary:     Effort: Pulmonary effort is normal.     Breath sounds: Normal breath sounds.     Comments: Clear and slightly diminished throughout.  Intermittent dry cough present. Abdominal:     General: Bowel sounds are normal.     Palpations: Abdomen is soft. There is no hepatomegaly or splenomegaly.  Genitourinary:    Penis: Normal.      Rectum: Normal.  Musculoskeletal: Normal range of motion.  Skin:    General: Skin is warm and dry.     Capillary Refill: Capillary refill takes less than 2 seconds.     Findings: No rash.  Neurological:     Mental Status: He is alert and oriented to person, place, and time.     Deep Tendon Reflexes: Reflexes are normal and symmetric.  Psychiatric:        Behavior: Behavior normal.        Thought Content: Thought content normal.        Judgment: Judgment normal.     Results for orders placed or performed in visit on 02/09/18  Veritor Flu A/B Waived  Result Value Ref Range   Influenza A Negative Negative   Influenza B Negative Negative      Assessment & Plan:   Problem List Items Addressed This Visit      Other   Cough - Primary    Continued, slightly better.  Will obtain CXR today.  Continue finishing Prednisone.  Albuterol inhaler in house today.  No abx at this time, as improvement noted.  If CXR notes PNA, then will call in course of further abx.  Recommend use of daily Claritin at home, humidifier, and Flonase.  Increase hydration at home.      Relevant Medications   albuterol (PROVENTIL) (2.5 MG/3ML) 0.083% nebulizer solution 2.5 mg (Start on 02/17/2018 10:00 AM)   Other  Relevant Orders   DG Chest 2 View       Follow up plan: Return if symptoms worsen or fail to improve.

## 2018-02-17 NOTE — Assessment & Plan Note (Signed)
Continued, slightly better.  Will obtain CXR today.  Continue finishing Prednisone.  Albuterol inhaler in house today.  No abx at this time, as improvement noted.  If CXR notes PNA, then will call in course of further abx.  Recommend use of daily Claritin at home, humidifier, and Flonase.  Increase hydration at home.

## 2018-02-28 ENCOUNTER — Ambulatory Visit: Payer: BLUE CROSS/BLUE SHIELD | Admitting: Family Medicine

## 2018-03-10 ENCOUNTER — Ambulatory Visit: Payer: BLUE CROSS/BLUE SHIELD | Admitting: Family Medicine

## 2018-03-25 ENCOUNTER — Encounter: Payer: Self-pay | Admitting: Gynecology

## 2018-03-25 ENCOUNTER — Ambulatory Visit
Admission: EM | Admit: 2018-03-25 | Discharge: 2018-03-25 | Disposition: A | Discharge status: Home / Self Care | Payer: BLUE CROSS/BLUE SHIELD | Attending: Emergency Medicine | Admitting: Emergency Medicine

## 2018-03-25 ENCOUNTER — Ambulatory Visit (INDEPENDENT_AMBULATORY_CARE_PROVIDER_SITE_OTHER): Payer: BLUE CROSS/BLUE SHIELD

## 2018-03-25 ENCOUNTER — Other Ambulatory Visit: Payer: Self-pay

## 2018-03-25 DIAGNOSIS — M1711 Unilateral primary osteoarthritis, right knee: Secondary | ICD-10-CM

## 2018-03-25 DIAGNOSIS — M25561 Pain in right knee: Secondary | ICD-10-CM | POA: Diagnosis not present

## 2018-03-25 MED ORDER — MELOXICAM 15 MG PO TABS: 15.0000 mg | ORAL_TABLET | Freq: Every day | ORAL | 0 refills | Status: DC

## 2018-03-25 NOTE — ED Triage Notes (Signed)
Patient c/o swelling in right knee.

## 2018-03-25 NOTE — ED Notes (Signed)
Knee immobilizer by Jesse Sans, CMA

## 2018-03-25 NOTE — ED Provider Notes (Signed)
[facility]    CSN: [id number] Arrival date & time: 03/25/18  1424     History   Chief Complaint No chief complaint on file.   HPI Kenneth Blair is a 58 y.o. male.   HPI  58 year-old male presents with swelling in his right knee.  He has knee pain that he feels mostly medially.  His job entails frequent squatting and kneeling.  His x-rays the left knee reviewed from 2018 showed medial joint space narrowing with loose foreign body seen posteriorly.  No fever or chills.  The past his left knee had been abraded and injected with cortisone and Marcaine which is serviced him for a good number of years.         Past Medical History:  Diagnosis Date  . Head ache   . History of knee problem   . Hyperlipidemia   . Hypertension   . Problems with hearing   . Reflux     Patient Active Problem List   Diagnosis Date Noted  . Cough 02/17/2018  . COPD exacerbation (HCC) 02/09/2018  . Tobacco abuse 01/09/2016  . Obesity 01/09/2016  . 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 Sleep apnea 05/02/2015  . Hyperlipidemia 01/03/2015  . Hypertension 01/03/2015  . Knee pain 01/03/2015    Past Surgical History:  Procedure Laterality Date  . KNEE SURGERY Left        Home Medications    Prior to Admission medications   Medication Sig Start Date End Date Taking? Authorizing Provider  amLODipine (NORVASC) 10 MG tablet Take 1 tablet (10 mg total) by mouth daily. 11/02/17  Yes Trey Sailors, PA-C  aspirin 81 MG tablet Take 81 mg by mouth daily.   Yes [provider]  famotidine (PEPCID) 20 MG tablet Take 20 mg by mouth daily.    Yes [provider]  fluticasone (FLONASE) 50 MCG/ACT nasal spray Place 2 sprays into both nostrils daily. 02/17/18  Yes Cannady, Jolene T, NP  lisinopril-hydrochlorothiazide (PRINZIDE,ZESTORETIC) 20-25 MG tablet TAKE 1 TABLET BY MOUTH EVERY DAY 12/28/17  Yes Cannady, Jolene T, NP  Multiple Vitamin (MULTI-VITAMINS) TABS Take 1 tablet  by mouth daily.    Yes [provider]  naproxen (NAPROSYN) 500 MG tablet Take 1 tablet (500 mg total) by mouth 2 (two) times daily with a meal. 01/01/17  Yes Lutricia Feil, PA-C  PROAIR HFA 108 (90 Base) MCG/ACT inhaler INHALE 2 PUFFS INTO THE LUNGS EVERY 4 (FOUR) HOURS AS NEEDED FOR WHEEZING OR SHORTNESS OF BREATH. 11/21/17  Yes Particia Nearing, PA-C  meloxicam (MOBIC) 15 MG tablet Take 1 tablet (15 mg total) by mouth daily. 03/25/18   Lutricia Feil, PA-C    Family History Family History  Problem Relation Age of Onset  . Heart disease Mother   . Hypertension Mother   . Stroke Mother   . Asthma Father   . Heart disease Father   . Hypertension Father   . Cancer Brother        Bone  . Hypertension Brother   . Hypertension Son     Social History Social History   Tobacco Use  . Smoking status: Current Every Day Smoker    Packs/day: 1.00    Years: 35.00    Pack years: 35.00    Types: Cigarettes  . Smokeless tobacco: Never Used  Substance Use Topics  . Alcohol use: Yes    Alcohol/week: 3.0 standard drinks    Types: 3 Cans of  beer per week    Comment: weekends  . Drug use: No     Allergies   Patient has no known allergies.   Review of Systems Review of Systems  Constitutional: Positive for activity change. Negative for appetite change, chills, fatigue and fever.  Musculoskeletal: Positive for arthralgias and joint swelling.  All other systems reviewed and are negative.    Physical Exam Triage Vital Signs ED Triage Vitals  Enc Vitals Group     BP 03/25/18 1522 (!) 143/69     Pulse Rate 03/25/18 1522 73     Resp 03/25/18 1522 18     Temp 03/25/18 1522 98.1 F (36.7 C)     Temp Source 03/25/18 1522 Oral     SpO2 03/25/18 1522 98 %     Weight 03/25/18 1520 279 lb (126.6 kg)     Height 03/25/18 1520 6' (1.829 m)     Head Circumference --      Peak Flow --      Pain Score 03/25/18 1520 3     Pain Loc --      Pain Edu? --      Excl. in GC?  --    No data found.  Updated Vital Signs BP (!) 143/69 (BP Location: Left Arm)   Pulse 73   Temp 98.1 F (36.7 C) (Oral)   Resp 18   Ht 6' (1.829 m)   Wt 279 lb (126.6 kg)   SpO2 98%   BMI 37.84 kg/m   Visual Acuity Right Eye Distance:   Left Eye Distance:   Bilateral Distance:    Right Eye Near:   Left Eye Near:    Bilateral Near:     Physical Exam Vitals signs and nursing note reviewed.  Constitutional:      General: He is not in acute distress.    Appearance: Normal appearance. He is obese. He is not ill-appearing, toxic-appearing or diaphoretic.  HENT:     Head: Normocephalic.     Nose: Nose normal.  Eyes:     General:        Right eye: No discharge.        Left eye: No discharge.     Conjunctiva/sclera: Conjunctivae normal.  Neck:     Musculoskeletal: Normal range of motion.  Musculoskeletal:        General: Swelling and tenderness present.     Right lower leg: Edema present.     Comments: Exam of the right knee shows 3+ effusion.  He has no retropatellar tenderness present.  Lateral ligaments are strong to clinical testing.  He has a negative anterior drawer sign.  Does have joint line tenderness medially.  Motion from 100 degrees to full extension.  Neurological:     Mental Status: He is alert.      UC Treatments / Results  Labs (all labs ordered are listed, but only abnormal results are displayed) Labs Reviewed - No data to display  EKG None  Radiology Dg Knee Complete 4 Views Right  Result Date: 03/25/2018 CLINICAL DATA:  Chronic right knee pain. EXAM: RIGHT KNEE - COMPLETE 4+ VIEW COMPARISON:  05/30/2012. FINDINGS: Moderate to marked medial joint space narrowing significant progression. Mild medial and tibial spine spur formation. Mild to moderate patellofemoral spur formation. Small to moderate-sized effusion. IMPRESSION: 1. Medial and patellofemoral degenerative changes. 2. Small to moderate-sized effusion. Electronically Signed   By: Beckie Salts M.D.   On: 03/25/2018 17:41    Procedures Procedures (  including critical care time)  Medications Ordered in UC Medications - No data to display  Initial Impression / Assessment and Plan / UC Course  I have reviewed the triage vital signs and the nursing notes.  Pertinent labs & imaging results that were available during my care of the patient were reviewed by me and considered in my medical decision making (see chart for details).   I reviewed his x-rays with him.  And the medial joint space narrowing that he has.  He would likely do well with an injection but will eventually need to have resurfacing of the knee.  Right him with a knee immobilizer today that he will wear for active times.  Also place him on Mobic on a daily basis to be taken with food.  [facility]   Final Clinical Impressions(s) / UC Diagnoses   Final diagnoses:  Primary osteoarthritis of right knee     Discharge Instructions     Wear brace for activities.  [facility] as [facility]    ED Prescriptions    Medication Sig Dispense Auth. Provider   meloxicam (MOBIC) 15 MG tablet Take 1 tablet (15 mg total) by mouth daily. 30 tablet Lutricia Feiloemer, Daira Hine P, PA-C     Controlled Substance Prescriptions [place] Controlled Substance Registry consulted? Not Applicable   Lutricia FeilRoemer, Roselie Cirigliano P, PA-C 03/25/18 1815

## 2018-03-25 NOTE — Discharge Instructions (Signed)
Wear brace for activities.  Schedule an appointment [facility] as soon as possible

## 2018-03-26 ENCOUNTER — Other Ambulatory Visit: Payer: Self-pay | Admitting: Nurse Practitioner

## 2018-03-26 DIAGNOSIS — I10 Essential (primary) hypertension: Secondary | ICD-10-CM

## 2018-03-27 NOTE — Telephone Encounter (Signed)
Needs a follow up appointment.

## 2018-03-27 NOTE — Telephone Encounter (Signed)
Can you forward to Dr. Laural Benes?  Looks like this is her patient.  I filled last for her in the afternoon when she was off I think.

## 2018-03-27 NOTE — Telephone Encounter (Signed)
Requested medication (s) are due for refill today: yes  Requested medication (s) are on the active medication list: yes  Last refill:  12/28/17 for 90 tabs and 1 refill  Future visit scheduled: no  Notes to clinic:  courtesy of 30 days of medication given. Called patient and left message to call and schedule an appointment for his refill. ACEI + Diuretic Combos Failed.  Requested Prescriptions  Pending Prescriptions Disp Refills   lisinopril-hydrochlorothiazide (PRINZIDE,ZESTORETIC) 20-25 MG tablet [Pharmacy Med Name: LISINOPRIL-HCTZ 20-25 MG TAB] 30 tablet 0    Sig: TAKE 1 TABLET BY MOUTH EVERY DAY     Cardiovascular:  ACEI + Diuretic Combos Failed - 03/26/2018  1:26 AM      Failed - Na in normal range and within 180 days    Sodium  Date Value Ref Range Status  01/28/2017 142 134 - 144 mmol/L Final  05/30/2012 143 136 - 145 mmol/L Final         Failed - K in normal range and within 180 days    Potassium  Date Value Ref Range Status  01/28/2017 3.9 3.5 - 5.2 mmol/L Final  05/30/2012 4.2 3.5 - 5.1 mmol/L Final         Failed - Cr in normal range and within 180 days    Creatinine  Date Value Ref Range Status  05/30/2012 1.11 0.60 - 1.30 mg/dL Final   Creatinine, Ser  Date Value Ref Range Status  01/28/2017 1.00 0.76 - 1.27 mg/dL Final         Failed - Ca in normal range and within 180 days    Calcium  Date Value Ref Range Status  01/28/2017 9.5 8.7 - 10.2 mg/dL Final   Calcium, Total  Date Value Ref Range Status  05/30/2012 9.4 8.5 - 10.1 mg/dL Final         Failed - Last BP in normal range    BP Readings from Last 1 Encounters:  03/25/18 (!) 143/69         Passed - Patient is not pregnant      Passed - Valid encounter within last 6 months    Recent Outpatient Visits          1 month ago Cough   [facility] [place], [place] T, NP   1 month ago COPD exacerbation (HCC)   [facility] Pierz, Megan P, DO   11 months ago 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

## 2018-03-27 NOTE — Telephone Encounter (Signed)
Courtesy of 30 days of medication and no refills given.

## 2018-03-28 ENCOUNTER — Encounter: Payer: Self-pay | Admitting: Family Medicine

## 2018-03-28 NOTE — Telephone Encounter (Signed)
Letter printed to mail. °

## 2018-04-04 ENCOUNTER — Ambulatory Visit (INDEPENDENT_AMBULATORY_CARE_PROVIDER_SITE_OTHER): Payer: BLUE CROSS/BLUE SHIELD | Admitting: Unknown Physician Specialty

## 2018-04-04 ENCOUNTER — Ambulatory Visit: Payer: BLUE CROSS/BLUE SHIELD | Admitting: Family Medicine

## 2018-04-04 ENCOUNTER — Encounter: Payer: Self-pay | Admitting: Family Medicine

## 2018-04-04 VITALS — BP 136/78 | HR 71 | Ht 72.0 in | Wt 266.0 lb

## 2018-04-04 VITALS — BP 136/78 | HR 64 | Temp 98.4°F | Ht 72.0 in | Wt 266.0 lb

## 2018-04-04 DIAGNOSIS — Z13220 Encounter for screening for lipoid disorders: Secondary | ICD-10-CM

## 2018-04-04 DIAGNOSIS — Z23 Encounter for immunization: Secondary | ICD-10-CM | POA: Diagnosis not present

## 2018-04-04 DIAGNOSIS — M25561 Pain in right knee: Secondary | ICD-10-CM | POA: Diagnosis not present

## 2018-04-04 DIAGNOSIS — G8929 Other chronic pain: Secondary | ICD-10-CM

## 2018-04-04 DIAGNOSIS — Z125 Encounter for screening for malignant neoplasm of prostate: Secondary | ICD-10-CM | POA: Diagnosis not present

## 2018-04-04 DIAGNOSIS — I10 Essential (primary) hypertension: Secondary | ICD-10-CM | POA: Diagnosis not present

## 2018-04-04 DIAGNOSIS — E782 Mixed hyperlipidemia: Secondary | ICD-10-CM

## 2018-04-04 DIAGNOSIS — J449 Chronic obstructive pulmonary disease, unspecified: Secondary | ICD-10-CM | POA: Diagnosis not present

## 2018-04-04 DIAGNOSIS — Z024 Encounter for examination for driving license: Secondary | ICD-10-CM

## 2018-04-04 LAB — UA/M W/RFLX CULTURE, ROUTINE
Bilirubin, UA: NEGATIVE
Glucose, UA: NEGATIVE
Ketones, UA: NEGATIVE
Leukocytes, UA: NEGATIVE
Nitrite, UA: NEGATIVE
Protein, UA: NEGATIVE
Specific Gravity, UA: 1.02 (ref 1.005–1.030)
Urobilinogen, Ur: 0.2 mg/dL (ref 0.2–1.0)
pH, UA: 8.5 — ABNORMAL HIGH (ref 5.0–7.5)

## 2018-04-04 LAB — MICROALBUMIN, URINE WAIVED
Creatinine, Urine Waived: 100 mg/dL (ref 10–300)
Microalb, Ur Waived: 10 mg/L (ref 0–19)
Microalb/Creat Ratio: <30 mg/g (ref <30)

## 2018-04-04 LAB — MICROSCOPIC EXAMINATION
Bacteria, UA: NONE SEEN
Epithelial Cells (non renal): NONE SEEN /hpf (ref 0–10)
WBC, UA: NONE SEEN /hpf (ref 0–5)

## 2018-04-04 MED ORDER — AMLODIPINE BESYLATE 10 MG PO TABS: 10.0000 mg | ORAL_TABLET | Freq: Every day | ORAL | 1 refills | Status: DC

## 2018-04-04 MED ORDER — ALBUTEROL SULFATE HFA 108 (90 BASE) MCG/ACT IN AERS: 2.0000 | INHALATION_SPRAY | RESPIRATORY_TRACT | 0 refills | Status: DC | PRN

## 2018-04-04 MED ORDER — LISINOPRIL-HYDROCHLOROTHIAZIDE 20-25 MG PO TABS: 1.0000 | ORAL_TABLET | Freq: Every day | ORAL | 1 refills | Status: DC

## 2018-04-04 NOTE — Assessment & Plan Note (Addendum)
Spirometry shows mild obstruction.  OK for occasional albuterol use.  Tobacco education given

## 2018-04-04 NOTE — Patient Instructions (Signed)
Influenza (Flu) Vaccine (Inactivated or Recombinant): What You Need to Know  1. Why get vaccinated?  Influenza vaccine can prevent influenza (flu).  Flu is a contagious disease that spreads around the [place] every year, usually between October and May. Anyone can get the flu, but it is more dangerous for some people. Infants and young children, people 58 years of age and older, pregnant women, and people with certain health conditions or a weakened immune system are at greatest risk of flu complications.  Pneumonia, bronchitis, sinus infections and ear infections are examples of flu-related complications. If you have a medical condition, such as heart disease, cancer or diabetes, flu can make it worse.  Flu can cause fever and chills, sore throat, muscle aches, fatigue, cough, headache, and runny or stuffy nose. Some people may have vomiting and diarrhea, though this is more common in children than adults.  Each year thousands of people in the [place] die from flu, and many more are hospitalized. Flu vaccine prevents millions of illnesses and flu-related visits to the doctor each year.  2. Influenza vaccine  CDC recommends everyone 6 months of age and older get vaccinated every flu season. Children 6 months through 8 years of age may need 2 doses during a single flu season. Everyone else needs only 1 dose each flu season.  It takes about 2 weeks for protection to develop after vaccination.  There are many flu viruses, and they are always changing. Each year a new flu vaccine is made to protect against three or four viruses that are likely to cause disease in the upcoming flu season. Even when the vaccine doesn't exactly match these viruses, it may still provide some protection.  Influenza vaccine does not cause flu.  Influenza vaccine may be given at the same time as other vaccines.  3. Talk with your health care provider  Tell your vaccine provider if the person getting the vaccine:  · Has had an  allergic reaction after a previous dose of influenza vaccine, or has any severe, life-threatening allergies.  · Has ever had Guillain-Barré Syndrome (also called GBS).  In some cases, your health care provider may decide to postpone influenza vaccination to a future visit.  People with minor illnesses, such as a cold, may be vaccinated. People who are moderately or severely ill should usually wait until they recover before getting influenza vaccine.  Your health care provider can give you more information.  4. Risks of a vaccine reaction  · Soreness, redness, and swelling where shot is given, fever, muscle aches, and headache can happen after influenza vaccine.  · There may be a very small increased risk of Guillain-Barré Syndrome (GBS) after inactivated influenza vaccine (the flu shot).  Young children who get the flu shot along with pneumococcal vaccine (PCV13), and/or DTaP vaccine at the same time might be slightly more likely to have a seizure caused by fever. Tell your health care provider if a child who is getting flu vaccine has ever had a seizure.  People sometimes faint after medical procedures, including vaccination. Tell your provider if you feel dizzy or have vision changes or ringing in the ears.  As with any medicine, there is a very remote chance of a vaccine causing a severe allergic reaction, other serious injury, or death.  5. What if there is a serious problem?  An allergic reaction could occur after the vaccinated person leaves the clinic. If you see signs of a severe allergic reaction (hives, swelling   of the face and throat, difficulty breathing, a fast heartbeat, dizziness, or weakness), call 9-1-1 and get the person to the nearest hospital.  For other signs that concern you, call your health care provider.  Adverse reactions should be reported to the Vaccine Adverse Event Reporting System (VAERS). Your health care provider will usually file this report, or you can do it yourself. Visit the  VAERS website at www.vaers.hhs.gov or call 1-800-822-7967.VAERS is only for reporting reactions, and VAERS staff do not give medical advice.  6. The National Vaccine Injury Compensation Program  The National Vaccine Injury Compensation Program (VICP) is a federal program that was created to compensate people who may have been injured by certain vaccines. Visit the VICP website at www.hrsa.gov/vaccinecompensation or call 1-800-338-2382 to learn about the program and about filing a claim. There is a time limit to file a claim for compensation.  7. How can I learn more?  · Ask your healthcare provider.  · Call your local or state health department.  · Contact the Centers for Disease Control and Prevention (CDC):  ? Call 1-800-232-4636 (1-800-CDC-INFO) or  ? Visit CDC's www.cdc.gov/flu  Vaccine Information Statement (Interim) Inactivated Influenza Vaccine (10/13/2017)  This information is not intended to replace advice given to you by your health care provider. Make sure you discuss any questions you have with your health care provider.  Document Released: 12/10/2005 Document Revised: 10/17/2017 Document Reviewed: 10/17/2017  Elsevier Interactive Patient Education © 2019 Elsevier Inc.

## 2018-04-04 NOTE — Assessment & Plan Note (Signed)
Under good control on current regimen. Continue current regimen. Continue to monitor. Call with any concerns. Refills given. Labs checked today.  

## 2018-04-04 NOTE — Progress Notes (Signed)
BP 136/78   Pulse 71   Ht 6' (1.829 m)   Wt 266 lb (120.7 kg)   BMI 36.08 kg/m    Subjective:    Patient ID: Kenneth Blair, male    DOB: 06/17/1960, 58 y.o.   MRN: [id number]  HPI: Kenneth Blair is a 58 y.o. male  Pt is here for DOT PE.  99% CPAP compliance  Relevant past medical, surgical, family and social history reviewed and updated as indicated. Interim medical history since our last visit reviewed. Allergies and medications reviewed and updated.  Review of Systems  Constitutional: Negative.   HENT: Negative.   Eyes: Negative.   Respiratory: Negative.   Cardiovascular: Negative.   Gastrointestinal: Negative.   Endocrine: Negative.   Genitourinary: Negative.   Musculoskeletal: Positive for arthralgias.       Right knee  Skin: Negative.   Allergic/Immunologic: Negative.   Neurological: Negative.   Hematological: Negative.   Psychiatric/Behavioral: Negative.     Per HPI unless specifically indicated above     Objective:    BP 136/78   Pulse 71   Ht 6' (1.829 m)   Wt 266 lb (120.7 kg)   BMI 36.08 kg/m   Wt Readings from Last 3 Encounters:  04/04/18 266 lb (120.7 kg)  04/04/18 266 lb (120.7 kg)  03/25/18 279 lb (126.6 kg)    Physical Exam Constitutional:      General: He is not in acute distress.    Appearance: Normal appearance. He is well-developed.  HENT:     Head: Normocephalic and atraumatic.  Eyes:     General: Lids are normal. No scleral icterus.       Right eye: No discharge.        Left eye: No discharge.     Conjunctiva/sclera: Conjunctivae normal.  Neck:     Musculoskeletal: Normal range of motion and neck supple.     Vascular: No carotid bruit or JVD.  Cardiovascular:     Rate and Rhythm: Normal rate and regular rhythm.     Heart sounds: Normal heart sounds.  Pulmonary:     Effort: Pulmonary effort is normal. No respiratory distress.     Breath sounds: Normal breath sounds.  Abdominal:     Palpations: There is no  hepatomegaly or splenomegaly.  Musculoskeletal: Normal range of motion.  Skin:    General: Skin is warm and dry.     Coloration: Skin is not pale.     Findings: No rash.  Neurological:     Mental Status: He is alert and oriented to person, place, and time.  Psychiatric:        Behavior: Behavior normal.        Thought Content: Thought content normal.        Judgment: Judgment normal.     See form Results for orders placed or performed in visit on 02/09/18  Veritor Flu A/B Waived  Result Value Ref Range   Influenza A Negative Negative   Influenza B Negative Negative      Assessment & Plan:   Problem List Items Addressed This Visit      Unprioritized   COPD, mild (HCC) - Primary    Spirometry shows mild obstruction.  OK for occasional albuterol use.  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         Follow up plan: Return if symptoms worsen or fail to improve.

## 2018-04-04 NOTE — Assessment & Plan Note (Signed)
Rechecking levels today. Await results.  

## 2018-04-04 NOTE — Progress Notes (Signed)
BP 136/78   Pulse 64   Temp 98.4 F (36.9 C) (Oral)   Ht 6' (1.829 m)   Wt 266 lb (120.7 kg)   SpO2 97%   BMI 36.08 kg/m    Subjective:    Patient ID: Kenneth Blair, Kenneth Blair    DOB: 12/06/1960, 58 y.o.   MRN: [id number]  HPI: Kenneth Blair is a 58 y.o. male  Chief Complaint  Patient presents with  . Medication Management    HTN  . Knee Pain    Right. Constant, would like to schedule f/u for steroid  . Health Maintenance    Flu - given; Tdap - Declined   HYPERTENSION Hypertension status: controlled  Satisfied with current treatment? yes Duration of hypertension: chronic BP monitoring frequency:  not checking BP medication side effects:  no Medication compliance: excellent compliance Previous BP meds: lisinopril-hctz, amlodipine Aspirin: yes Recurrent headaches: no Visual changes: no Palpitations: no Dyspnea: no Chest pain: no Lower extremity edema: no Dizzy/lightheaded: no  KNEE PAIN Duration: chronic, worse in the last 2 weeks Involved knee: right Mechanism of injury: unknown Location:diffuse Onset: gradual Severity: severe  Quality:  Aching and throbbing Frequency: constant Radiation: no Aggravating factors: weight bearing, walking and running  Alleviating factors: meloxicam   Status: better Treatments attempted: rest, ice, heat, APAP, ibuprofen and aleve  Relief with NSAIDs?:  significant Weakness with weight bearing or walking: yes Sensation of giving way: no Locking: yes Popping: yes Bruising: no Swelling: yes Redness: no Paresthesias/decreased sensation: no Fevers: no  Relevant past medical, surgical, family and social history reviewed and updated as indicated. Interim medical history since our last visit reviewed. Allergies and medications reviewed and updated.  Review of Systems  Constitutional: Negative.   Respiratory: Negative.   Cardiovascular: Negative.   Musculoskeletal: Positive for arthralgias and gait problem. Negative  for back pain, joint swelling, myalgias, neck pain and neck stiffness.  Skin: Negative.   Psychiatric/Behavioral: Negative.     Per HPI unless specifically indicated above     Objective:    BP 136/78   Pulse 64   Temp 98.4 F (36.9 C) (Oral)   Ht 6' (1.829 m)   Wt 266 lb (120.7 kg)   SpO2 97%   BMI 36.08 kg/m   Wt Readings from Last 3 Encounters:  04/04/18 266 lb (120.7 kg)  03/25/18 279 lb (126.6 kg)  02/17/18 268 lb 3.2 oz (121.7 kg)    Physical Exam Vitals signs and nursing note reviewed.  Constitutional:      General: He is not in acute distress.    Appearance: Normal appearance. He is not ill-appearing, toxic-appearing or diaphoretic.  HENT:     Head: Normocephalic and atraumatic.     Right Ear: External ear normal.     Left Ear: External ear normal.     Nose: Nose normal.     Mouth/Throat:     Mouth: Mucous membranes are moist.     Pharynx: Oropharynx is clear.  Eyes:     General: No scleral icterus.       Right eye: No discharge.        Left eye: No discharge.     Extraocular Movements: Extraocular movements intact.     Conjunctiva/sclera: Conjunctivae normal.     Pupils: Pupils are equal, round, and reactive to light.  Neck:     Musculoskeletal: Normal range of motion and neck supple.  Cardiovascular:     Rate and Rhythm: Normal rate and regular rhythm.  Pulses: Normal pulses.     Heart sounds: Normal heart sounds. No murmur. No friction rub. No gallop.   Pulmonary:     Effort: Pulmonary effort is normal. No respiratory distress.     Breath sounds: Normal breath sounds. No stridor. No wheezing, rhonchi or rales.  Chest:     Chest wall: No tenderness.  Musculoskeletal:     Comments: Antalgic gait  Skin:    General: Skin is warm and dry.     Capillary Refill: Capillary refill takes less than 2 seconds.     Coloration: Skin is not jaundiced or pale.     Findings: No bruising, erythema, lesion or rash.  Neurological:     General: No focal deficit  present.     Mental Status: He is alert and oriented to person, place, and time. Mental status is at baseline.  Psychiatric:        Mood and Affect: Mood normal.        Behavior: Behavior normal.        Thought Content: Thought content normal.        Judgment: Judgment normal.     Results for orders placed or performed in visit on 02/09/18  Veritor Flu A/B Waived  Result Value Ref Range   Influenza A Negative Negative   Influenza B Negative Negative      Assessment & Plan:   Problem List Items Addressed This Visit      Cardiovascular and Mediastinum   Hypertension - Primary    Under good control on current regimen. Continue current regimen. Continue to monitor. Call with any concerns. Refills given. Labs checked today.       Relevant Medications   amLODipine (NORVASC) 10 MG tablet   lisinopril-hydrochlorothiazide (PRINZIDE,ZESTORETIC) 20-25 MG tablet   Other Relevant Orders   CBC with Differential/Platelet   Comprehensive metabolic panel   Microalbumin, Urine Waived   TSH   UA/M w/rflx Culture, Routine     Other   Hyperlipidemia    Rechecking levels today. Await results.       Relevant Medications   amLODipine (NORVASC) 10 MG tablet   lisinopril-hydrochlorothiazide (PRINZIDE,ZESTORETIC) 20-25 MG tablet   Knee pain    Will get him into ortho. Referral generated today. Call with any concerns.       Relevant Orders   Ambulatory referral to Orthopedic Surgery    Other Visit Diagnoses    Needs flu shot       Flu shot given today.   Relevant Orders   Flu Vaccine QUAD 6+ mos PF IM (Fluarix Quad PF) (Completed)   Screening for prostate cancer       Labs drawn today. Await results.    Relevant Orders   PSA   Screening for cholesterol level       Labs drawn today. Await results.    Relevant Orders   Lipid Panel w/o Chol/HDL Ratio       Follow up plan: Return in about 6 months (around 10/03/2018) for Physical.

## 2018-04-04 NOTE — Assessment & Plan Note (Signed)
Will get him into ortho. Referral generated today. Call with any concerns.

## 2018-04-05 ENCOUNTER — Encounter: Payer: Self-pay | Admitting: Family Medicine

## 2018-04-05 LAB — CBC WITH DIFFERENTIAL/PLATELET
Basophils Absolute: 0 10*3/uL (ref 0.0–0.2)
Basos: 0 %
EOS (ABSOLUTE): 0.2 10*3/uL (ref 0.0–0.4)
Eos: 3 %
Hematocrit: 40.7 % (ref 37.5–51.0)
Hemoglobin: 13.8 g/dL (ref 13.0–17.7)
Immature Grans (Abs): 0 10*3/uL (ref 0.0–0.1)
Immature Granulocytes: 0 %
Lymphocytes Absolute: 2.1 10*3/uL (ref 0.7–3.1)
Lymphs: 30 %
MCH: 32.3 pg (ref 26.6–33.0)
MCHC: 33.9 g/dL (ref 31.5–35.7)
MCV: 95 fL (ref 79–97)
Monocytes Absolute: 0.5 10*3/uL (ref 0.1–0.9)
Monocytes: 7 %
Neutrophils Absolute: 4.3 10*3/uL (ref 1.4–7.0)
Neutrophils: 60 %
Platelets: 186 10*3/uL (ref 150–450)
RBC: 4.27 x10E6/uL (ref 4.14–5.80)
RDW: 12.9 % (ref 11.6–15.4)
WBC: 7.3 10*3/uL (ref 3.4–10.8)

## 2018-04-05 LAB — COMPREHENSIVE METABOLIC PANEL
ALT: 11 IU/L (ref 0–44)
AST: 15 IU/L (ref 0–40)
Albumin/Globulin Ratio: 2.3 — ABNORMAL HIGH (ref 1.2–2.2)
Albumin: 4.4 g/dL (ref 3.8–4.9)
Alkaline Phosphatase: 77 IU/L (ref 39–117)
BUN/Creatinine Ratio: 12 (ref 9–20)
BUN: 13 mg/dL (ref 6–24)
Bilirubin Total: 0.2 mg/dL (ref 0.0–1.2)
CO2: 25 mmol/L (ref 20–29)
Calcium: 9.5 mg/dL (ref 8.7–10.2)
Chloride: 102 mmol/L (ref 96–106)
Creatinine, Ser: 1.07 mg/dL (ref 0.76–1.27)
GFR calc Af Amer: 89 mL/min/{1.73_m2} (ref >59)
GFR calc non Af Amer: 77 mL/min/{1.73_m2} (ref >59)
Globulin, Total: 1.9 g/dL (ref 1.5–4.5)
Glucose: 95 mg/dL (ref 65–99)
Potassium: 4.4 mmol/L (ref 3.5–5.2)
Sodium: 141 mmol/L (ref 134–144)
Total Protein: 6.3 g/dL (ref 6.0–8.5)

## 2018-04-05 LAB — TSH: TSH: 1.82 u[IU]/mL (ref 0.450–4.500)

## 2018-04-05 LAB — LIPID PANEL W/O CHOL/HDL RATIO
Cholesterol, Total: 181 mg/dL (ref 100–199)
HDL: 37 mg/dL — ABNORMAL LOW (ref >39)
LDL Calculated: 109 mg/dL — ABNORMAL HIGH (ref 0–99)
Triglycerides: 177 mg/dL — ABNORMAL HIGH (ref 0–149)
VLDL Cholesterol Cal: 35 mg/dL (ref 5–40)

## 2018-04-05 LAB — PSA: Prostate Specific Ag, Serum: 0.5 ng/mL (ref 0.0–4.0)

## 2018-04-21 DIAGNOSIS — G4733 Obstructive sleep apnea (adult) (pediatric): Secondary | ICD-10-CM | POA: Diagnosis not present

## 2018-04-26 DIAGNOSIS — M2351 Chronic instability of knee, right knee: Secondary | ICD-10-CM | POA: Diagnosis not present

## 2018-04-26 DIAGNOSIS — E669 Obesity, unspecified: Secondary | ICD-10-CM | POA: Diagnosis not present

## 2018-04-26 DIAGNOSIS — M1711 Unilateral primary osteoarthritis, right knee: Secondary | ICD-10-CM | POA: Diagnosis not present

## 2018-05-10 DIAGNOSIS — E669 Obesity, unspecified: Secondary | ICD-10-CM | POA: Diagnosis not present

## 2018-05-10 DIAGNOSIS — M2351 Chronic instability of knee, right knee: Secondary | ICD-10-CM | POA: Diagnosis not present

## 2018-05-10 DIAGNOSIS — M1711 Unilateral primary osteoarthritis, right knee: Secondary | ICD-10-CM | POA: Diagnosis not present

## 2018-07-26 DIAGNOSIS — G4733 Obstructive sleep apnea (adult) (pediatric): Secondary | ICD-10-CM | POA: Diagnosis not present

## 2018-09-25 ENCOUNTER — Other Ambulatory Visit: Payer: Self-pay | Admitting: Family Medicine

## 2018-09-25 DIAGNOSIS — J449 Chronic obstructive pulmonary disease, unspecified: Secondary | ICD-10-CM

## 2018-09-25 NOTE — Telephone Encounter (Signed)
Requested Prescriptions  Pending Prescriptions Disp Refills  . albuterol (PROAIR HFA) 108 (90 Base) MCG/ACT inhaler [Pharmacy Med Name: PROAIR HFA 90 MCG INHALER] 8.5 g 0    Sig: Inhale 2 puffs into the lungs every 4 (four) hours as needed for wheezing or shortness of breath. Please schedule follow up appointment before further refills.     Pulmonology:  Beta Agonists Failed - 09/25/2018 12:16 PM      Failed - One inhaler should last at least one month. If the patient is requesting refills earlier, contact the patient to check for uncontrolled symptoms.      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

## 2018-09-28 ENCOUNTER — Other Ambulatory Visit: Payer: Self-pay | Admitting: Family Medicine

## 2018-09-28 DIAGNOSIS — I10 Essential (primary) hypertension: Secondary | ICD-10-CM

## 2018-10-15 ENCOUNTER — Other Ambulatory Visit: Payer: Self-pay | Admitting: Family Medicine

## 2018-10-15 DIAGNOSIS — J449 Chronic obstructive pulmonary disease, unspecified: Secondary | ICD-10-CM

## 2018-10-15 NOTE — Telephone Encounter (Signed)
Requested medication (s) are due for refill today:   Yes  Requested medication (s) are on the active medication list:   Yes  Future visit scheduled:   No   Per note needs OV for further refills   Last ordered: 09/25/2018  8.5 g  0 refills by Dr. Wynetta Emery   Requested Prescriptions  Pending Prescriptions Disp Refills   PROAIR HFA 108 (39 Base) MCG/ACT inhaler [Pharmacy Med Name: PROAIR HFA 90 MCG INHALER]  0    Sig: INHALE 2 PUFFS INTO THE LUNGS EVERY 4 HOURS AS NEEDED FOR WHEEZING OR SHORTNESS OF BREATH.     Pulmonology:  Beta Agonists Failed - 10/15/2018 12:54 AM      Failed - One inhaler should last at least one month. If the patient is requesting refills earlier, contact the patient to check for uncontrolled symptoms.      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

## 2018-10-16 NOTE — Telephone Encounter (Signed)
See PEC documentation below.  

## 2018-10-16 NOTE — Telephone Encounter (Signed)
Needs appointment

## 2018-10-17 NOTE — Telephone Encounter (Signed)
Appt made for 11/03/2018.

## 2018-11-03 ENCOUNTER — Ambulatory Visit: Payer: BLUE CROSS/BLUE SHIELD | Admitting: Family Medicine

## 2018-11-05 ENCOUNTER — Other Ambulatory Visit: Payer: Self-pay | Admitting: Family Medicine

## 2018-11-14 ENCOUNTER — Ambulatory Visit: Payer: BC Managed Care – PPO | Admitting: Family Medicine

## 2018-11-14 ENCOUNTER — Other Ambulatory Visit: Payer: Self-pay

## 2018-11-14 ENCOUNTER — Encounter: Payer: Self-pay | Admitting: Family Medicine

## 2018-11-14 VITALS — BP 124/74 | HR 60 | Temp 98.4°F | Ht 72.0 in | Wt 271.0 lb

## 2018-11-14 DIAGNOSIS — J449 Chronic obstructive pulmonary disease, unspecified: Secondary | ICD-10-CM | POA: Diagnosis not present

## 2018-11-14 DIAGNOSIS — I10 Essential (primary) hypertension: Secondary | ICD-10-CM

## 2018-11-14 DIAGNOSIS — E782 Mixed hyperlipidemia: Secondary | ICD-10-CM | POA: Diagnosis not present

## 2018-11-14 MED ORDER — FLUTICASONE PROPIONATE 50 MCG/ACT NA SUSP: 2.0000 | Freq: Every day | NASAL | 6 refills | Status: DC

## 2018-11-14 MED ORDER — NAPROXEN 500 MG PO TABS: 500.0000 mg | ORAL_TABLET | Freq: Two times a day (BID) | ORAL | 1 refills | Status: DC

## 2018-11-14 MED ORDER — AMLODIPINE BESYLATE 10 MG PO TABS: 10.0000 mg | ORAL_TABLET | Freq: Every day | ORAL | 1 refills | Status: DC

## 2018-11-14 MED ORDER — LISINOPRIL-HYDROCHLOROTHIAZIDE 20-25 MG PO TABS: 1.0000 | ORAL_TABLET | Freq: Every day | ORAL | 1 refills | Status: DC

## 2018-11-14 MED ORDER — ALBUTEROL SULFATE HFA 108 (90 BASE) MCG/ACT IN AERS: 2.0000 | INHALATION_SPRAY | Freq: Four times a day (QID) | RESPIRATORY_TRACT | 6 refills | Status: DC | PRN

## 2018-11-14 NOTE — Progress Notes (Signed)
BP 124/74   Pulse 60   Temp 98.4 F (36.9 C) (Oral)   Ht 6' (1.829 m)   Wt 271 lb (122.9 kg)   SpO2 96%   BMI 36.75 kg/m    Subjective:    Patient ID: Kenneth Blair, male    DOB: [birth year], 58 y.o.   MRN: [id number]  HPI: Kenneth Blair is a 58 y.o. male  Chief Complaint  Patient presents with  . Hypertension  . Hyperlipidemia  . COPD    proair inhaler concerns   HYPERTENSION / HYPERLIPIDEMIA Satisfied with current treatment? yes Duration of hypertension: chronic BP monitoring frequency: not checking BP medication side effects: no Past BP meds: amlodipine, lisinopril-HCTZ Duration of hyperlipidemia: chronic Cholesterol medication side effects: not on anything Cholesterol supplements: none Past cholesterol medications: none Medication compliance: excellent compliance Aspirin: yes Recent stressors: no Recurrent headaches: no Visual changes: no Palpitations: no Dyspnea: no Chest pain: no Lower extremity edema: no Dizzy/lightheaded: no  COPD COPD status: controlled Satisfied with current treatment?: yes Oxygen use: no Dyspnea frequency: occasionally Cough frequency: in the AM Rescue inhaler frequency:  Every few days Limitation of activity: no Productive cough: No Pneumovax: Up to Date Influenza: Up to Date  Relevant past medical, surgical, family and social history reviewed and updated as indicated. Interim medical history since our last visit reviewed. Allergies and medications reviewed and updated.  Review of Systems  Constitutional: Negative.   Respiratory: Negative.   Cardiovascular: Negative.   Gastrointestinal: Negative.   Musculoskeletal: Negative.   Psychiatric/Behavioral: Negative.     Per HPI unless specifically indicated above     Objective:    BP 124/74   Pulse 60   Temp 98.4 F (36.9 C) (Oral)   Ht 6' (1.829 m)   Wt 271 lb (122.9 kg)   SpO2 96%   BMI 36.75 kg/m   Wt Readings from Last 3 Encounters:  11/14/18 271 lb  (122.9 kg)  04/04/18 266 lb (120.7 kg)  04/04/18 266 lb (120.7 kg)    Physical Exam Vitals signs and nursing note reviewed.  Constitutional:      General: He is not in acute distress.    Appearance: Normal appearance. He is not ill-appearing, toxic-appearing or diaphoretic.  HENT:     Head: Normocephalic and atraumatic.     Right Ear: External ear normal.     Left Ear: External ear normal.     Nose: Nose normal.     Mouth/Throat:     Mouth: Mucous membranes are moist.     Pharynx: Oropharynx is clear.  Eyes:     General: No scleral icterus.       Right eye: No discharge.        Left eye: No discharge.     Extraocular Movements: Extraocular movements intact.     Conjunctiva/sclera: Conjunctivae normal.     Pupils: Pupils are equal, round, and reactive to light.  Neck:     Musculoskeletal: Normal range of motion and neck supple.  Cardiovascular:     Rate and Rhythm: Normal rate and regular rhythm.     Pulses: Normal pulses.     Heart sounds: Normal heart sounds. No murmur. No friction rub. No gallop.   Pulmonary:     Effort: Pulmonary effort is normal. No respiratory distress.     Breath sounds: Normal breath sounds. No stridor. No wheezing, rhonchi or rales.  Chest:     Chest wall: No tenderness.  Musculoskeletal: Normal range of motion.  Skin:    General: Skin is warm and dry.     Capillary Refill: Capillary refill takes less than 2 seconds.     Coloration: Skin is not jaundiced or pale.     Findings: No bruising, erythema, lesion or rash.  Neurological:     General: No focal deficit present.     Mental Status: He is alert and oriented to person, place, and time. Mental status is at baseline.  Psychiatric:        Mood and Affect: Mood normal.        Behavior: Behavior normal.        Thought Content: Thought content normal.        Judgment: Judgment normal.     Results for orders placed or performed in visit on 04/04/18  Microscopic Examination   URINE  Result  Value Ref Range   WBC, UA None seen 0 - 5 /hpf   RBC, UA 0-2 0 - 2 /hpf   Epithelial Cells (non renal) None seen 0 - 10 /hpf   Bacteria, UA None seen None seen/Few  CBC with Differential/Platelet  Result Value Ref Range   WBC 7.3 3.4 - 10.8 x10E3/uL   RBC 4.27 4.14 - 5.80 x10E6/uL   Hemoglobin 13.8 13.0 - 17.7 g/dL   Hematocrit 40.0 86.7 - 51.0 %   MCV 95 79 - 97 fL   MCH 32.3 26.6 - 33.0 pg   MCHC 33.9 31.5 - 35.7 g/dL   RDW 61.9 50.9 - 32.6 %   Platelets 186 150 - 450 x10E3/uL   Neutrophils 60 Not Estab. %   Lymphs 30 Not Estab. %   Monocytes 7 Not Estab. %   Eos 3 Not Estab. %   Basos 0 Not Estab. %   Neutrophils Absolute 4.3 1.4 - 7.0 x10E3/uL   Lymphocytes Absolute 2.1 0.7 - 3.1 x10E3/uL   Monocytes Absolute 0.5 0.1 - 0.9 x10E3/uL   EOS (ABSOLUTE) 0.2 0.0 - 0.4 x10E3/uL   Basophils Absolute 0.0 0.0 - 0.2 x10E3/uL   Immature Granulocytes 0 Not Estab. %   Immature Grans (Abs) 0.0 0.0 - 0.1 x10E3/uL  Comprehensive metabolic panel  Result Value Ref Range   Glucose 95 65 - 99 mg/dL   BUN 13 6 - 24 mg/dL   Creatinine, Ser 7.12 0.76 - 1.27 mg/dL   GFR calc non Af Amer 77 >59 mL/min/1.73   GFR calc Af Amer 89 >59 mL/min/1.73   BUN/Creatinine Ratio 12 9 - 20   Sodium 141 134 - 144 mmol/L   Potassium 4.4 3.5 - 5.2 mmol/L   Chloride 102 96 - 106 mmol/L   CO2 25 20 - 29 mmol/L   Calcium 9.5 8.7 - 10.2 mg/dL   Total Protein 6.3 6.0 - 8.5 g/dL   Albumin 4.4 3.8 - 4.9 g/dL   Globulin, Total 1.9 1.5 - 4.5 g/dL   Albumin/Globulin Ratio 2.3 (H) 1.2 - 2.2   Bilirubin Total 0.2 0.0 - 1.2 mg/dL   Alkaline Phosphatase 77 39 - 117 IU/L   AST 15 0 - 40 IU/L   ALT 11 0 - 44 IU/L  Lipid Panel w/o Chol/HDL Ratio  Result Value Ref Range   Cholesterol, Total 181 100 - 199 mg/dL   Triglycerides 458 (H) 0 - 149 mg/dL   HDL 37 (L) >09 mg/dL   VLDL Cholesterol Cal 35 5 - 40 mg/dL   LDL Calculated 983 (H) 0 - 99 mg/dL  Microalbumin, Urine Waived  Result Value  Ref Range   Microalb, Ur  Waived 10 0 - 19 mg/L   Creatinine, Urine Waived 100 10 - 300 mg/dL   Microalb/Creat Ratio <30 <30 mg/g  PSA  Result Value Ref Range   Prostate Specific Ag, Serum 0.5 0.0 - 4.0 ng/mL  TSH  Result Value Ref Range   TSH 1.820 0.450 - 4.500 uIU/mL  UA/M w/rflx Culture, Routine   Specimen: Urine   URINE  Result Value Ref Range   Specific Gravity, UA 1.020 1.005 - 1.030   pH, UA 8.5 (H) 5.0 - 7.5   Color, UA Yellow Yellow   Appearance Ur Clear Clear   Leukocytes, UA Negative Negative   Protein, UA Negative Negative/Trace   Glucose, UA Negative Negative   Ketones, UA Negative Negative   RBC, UA Trace (A) Negative   Bilirubin, UA Negative Negative   Urobilinogen, Ur 0.2 0.2 - 1.0 mg/dL   Nitrite, UA Negative Negative   Microscopic Examination See below:       Assessment & Plan:   Problem List Items Addressed This Visit      Cardiovascular and Mediastinum   Hypertension - Primary    Under good control on current regimen. Continue current regimen. Continue to monitor. Call with any concerns. Refills given. Labs checked today.        Relevant Medications   lisinopril-hydrochlorothiazide (ZESTORETIC) 20-25 MG tablet   amLODipine (NORVASC) 10 MG tablet   Other Relevant Orders   Comprehensive metabolic panel     Respiratory   COPD, mild (HCC)    Under good control on current regimen. Continue current regimen. Continue to monitor. Call with any concerns. Refills given. Labs checked today.        Relevant Medications   fluticasone (FLONASE) 50 MCG/ACT nasal spray   albuterol (VENTOLIN HFA) 108 (90 Base) MCG/ACT inhaler     Other   Hyperlipidemia    Under good control on current regimen. Continue current regimen. Continue to monitor. Call with any concerns. Refills given. Labs checked today.        Relevant Medications   lisinopril-hydrochlorothiazide (ZESTORETIC) 20-25 MG tablet   amLODipine (NORVASC) 10 MG tablet   Other Relevant Orders   Comprehensive metabolic  panel   Lipid Panel w/o Chol/HDL Ratio       Follow up plan: Return in about 6 months (around 05/14/2019) for Physical.

## 2018-11-14 NOTE — Assessment & Plan Note (Signed)
Under good control on current regimen. Continue current regimen. Continue to monitor. Call with any concerns. Refills given. Labs checked today.  

## 2018-11-15 ENCOUNTER — Encounter: Payer: Self-pay | Admitting: Family Medicine

## 2018-11-15 LAB — COMPREHENSIVE METABOLIC PANEL
ALT: 10 IU/L (ref 0–44)
AST: 10 IU/L (ref 0–40)
Albumin/Globulin Ratio: 2.1 (ref 1.2–2.2)
Albumin: 4.4 g/dL (ref 3.8–4.9)
Alkaline Phosphatase: 83 IU/L (ref 39–117)
BUN/Creatinine Ratio: 11 (ref 9–20)
BUN: 12 mg/dL (ref 6–24)
Bilirubin Total: 0.2 mg/dL (ref 0.0–1.2)
CO2: 25 mmol/L (ref 20–29)
Calcium: 9.4 mg/dL (ref 8.7–10.2)
Chloride: 103 mmol/L (ref 96–106)
Creatinine, Ser: 1.08 mg/dL (ref 0.76–1.27)
GFR calc Af Amer: 87 mL/min/{1.73_m2} (ref >59)
GFR calc non Af Amer: 75 mL/min/{1.73_m2} (ref >59)
Globulin, Total: 2.1 g/dL (ref 1.5–4.5)
Glucose: 107 mg/dL — ABNORMAL HIGH (ref 65–99)
Potassium: 3.9 mmol/L (ref 3.5–5.2)
Sodium: 141 mmol/L (ref 134–144)
Total Protein: 6.5 g/dL (ref 6.0–8.5)

## 2018-11-15 LAB — LIPID PANEL W/O CHOL/HDL RATIO
Cholesterol, Total: 185 mg/dL (ref 100–199)
HDL: 38 mg/dL — ABNORMAL LOW (ref >39)
LDL Chol Calc (NIH): 111 mg/dL — ABNORMAL HIGH (ref 0–99)
Triglycerides: 206 mg/dL — ABNORMAL HIGH (ref 0–149)
VLDL Cholesterol Cal: 36 mg/dL (ref 5–40)

## 2018-11-20 ENCOUNTER — Telehealth: Payer: Self-pay

## 2018-11-20 NOTE — Telephone Encounter (Signed)
Patient states that he will take the naproxen for his back pain, and that they still gave him proair at the pharmacy.

## 2018-11-20 NOTE — Telephone Encounter (Signed)
Because it was on his medication list and hadn't been refilled in 6 months and he was out of refills. He does not have to take it if he doesn't need it.

## 2018-11-20 NOTE — Telephone Encounter (Signed)
Dr. Wynetta Emery, do you know that the naproxen is for? Copied from [place] [id number]. Topic: General - Other >> Nov 20, 2018  9:28 AM Sheran Luz wrote: Patient would like to know why Naproxen was sent into pharmacy. Patient is requesting call back from CMA to discuss.

## 2018-11-20 NOTE — Telephone Encounter (Signed)
Spoke with pharmacy they state that when it is time to refill ask them to run the ventolin.

## 2018-11-20 NOTE — Telephone Encounter (Signed)
Please check with the pharmacy to make sure they are giving him the cheapest albuterol

## 2018-11-20 NOTE — Telephone Encounter (Signed)
Patient states that he has never taken naproxen, that he has taken meloxicam, but wanted it taken off because he does not need it anymore.

## 2019-01-04 DIAGNOSIS — G4733 Obstructive sleep apnea (adult) (pediatric): Secondary | ICD-10-CM | POA: Diagnosis not present

## 2019-05-10 ENCOUNTER — Other Ambulatory Visit: Payer: Self-pay | Admitting: Family Medicine

## 2019-05-24 ENCOUNTER — Other Ambulatory Visit: Payer: Self-pay | Admitting: Family Medicine

## 2019-06-06 DIAGNOSIS — G4733 Obstructive sleep apnea (adult) (pediatric): Secondary | ICD-10-CM | POA: Diagnosis not present

## 2019-06-19 ENCOUNTER — Other Ambulatory Visit: Payer: Self-pay | Admitting: Family Medicine

## 2019-06-19 NOTE — Telephone Encounter (Signed)
Call to patient- scheduled for 5/3- RF per protocol

## 2019-07-02 ENCOUNTER — Other Ambulatory Visit: Payer: Self-pay

## 2019-07-02 ENCOUNTER — Ambulatory Visit (INDEPENDENT_AMBULATORY_CARE_PROVIDER_SITE_OTHER): Payer: BC Managed Care – PPO | Admitting: Family Medicine

## 2019-07-02 ENCOUNTER — Encounter: Payer: Self-pay | Admitting: Family Medicine

## 2019-07-02 VITALS — BP 130/58 | HR 82 | Temp 98.5°F | Wt 269.0 lb

## 2019-07-02 DIAGNOSIS — Z720 Tobacco use: Secondary | ICD-10-CM | POA: Diagnosis not present

## 2019-07-02 DIAGNOSIS — Z0000 Encounter for general adult medical examination without abnormal findings: Secondary | ICD-10-CM | POA: Diagnosis not present

## 2019-07-02 DIAGNOSIS — Z125 Encounter for screening for malignant neoplasm of prostate: Secondary | ICD-10-CM | POA: Diagnosis not present

## 2019-07-02 DIAGNOSIS — J449 Chronic obstructive pulmonary disease, unspecified: Secondary | ICD-10-CM | POA: Diagnosis not present

## 2019-07-02 DIAGNOSIS — I10 Essential (primary) hypertension: Secondary | ICD-10-CM

## 2019-07-02 DIAGNOSIS — E782 Mixed hyperlipidemia: Secondary | ICD-10-CM | POA: Diagnosis not present

## 2019-07-02 LAB — MICROALBUMIN, URINE WAIVED
Creatinine, Urine Waived: 100 mg/dL (ref 10–300)
Microalb, Ur Waived: 10 mg/L (ref 0–19)
Microalb/Creat Ratio: <30 mg/g (ref <30)

## 2019-07-02 LAB — UA/M W/RFLX CULTURE, ROUTINE
Bilirubin, UA: NEGATIVE
Glucose, UA: NEGATIVE
Ketones, UA: NEGATIVE
Leukocytes,UA: NEGATIVE
Nitrite, UA: NEGATIVE
Protein,UA: NEGATIVE
Specific Gravity, UA: 1.02 (ref 1.005–1.030)
Urobilinogen, Ur: 0.2 mg/dL (ref 0.2–1.0)
pH, UA: 5 (ref 5.0–7.5)

## 2019-07-02 LAB — MICROSCOPIC EXAMINATION
Bacteria, UA: NONE SEEN
WBC, UA: NONE SEEN /hpf (ref 0–5)

## 2019-07-02 MED ORDER — NAPROXEN 500 MG PO TABS: 500.0000 mg | ORAL_TABLET | Freq: Two times a day (BID) | ORAL | 1 refills | Status: DC

## 2019-07-02 MED ORDER — AMLODIPINE BESYLATE 10 MG PO TABS: 10.0000 mg | ORAL_TABLET | Freq: Every day | ORAL | 1 refills | Status: DC

## 2019-07-02 MED ORDER — LISINOPRIL-HYDROCHLOROTHIAZIDE 20-25 MG PO TABS: 1.0000 | ORAL_TABLET | Freq: Every day | ORAL | 1 refills | Status: DC

## 2019-07-02 MED ORDER — FLUTICASONE PROPIONATE 50 MCG/ACT NA SUSP: 2.0000 | Freq: Every day | NASAL | 6 refills | Status: AC

## 2019-07-02 MED ORDER — ALBUTEROL SULFATE HFA 108 (90 BASE) MCG/ACT IN AERS: 2.0000 | INHALATION_SPRAY | Freq: Four times a day (QID) | RESPIRATORY_TRACT | 6 refills | Status: AC | PRN

## 2019-07-02 NOTE — Assessment & Plan Note (Signed)
Not interested in quitting right now. Continue to monitor.  

## 2019-07-02 NOTE — Patient Instructions (Signed)

## 2019-07-02 NOTE — Assessment & Plan Note (Signed)
Under good control on current regimen. Continue current regimen. Continue to monitor. Call with any concerns. Refills given. Labs drawn today.   

## 2019-07-02 NOTE — Assessment & Plan Note (Signed)
Due to HTN, HLD and knee arthritis. Encouraged diet and exercise with the goal of losing 1-2lbs per week. Call with any concerns. Continue to monitor.

## 2019-07-02 NOTE — Assessment & Plan Note (Signed)
Lungs clear. Stable on albuterol. Continue to monitor. Call with any concerns. Refills given.

## 2019-07-02 NOTE — Progress Notes (Signed)
BP (!) 130/58 (BP Location: Left Arm, Cuff Size: Large)   Pulse 82   Temp 98.5 F (36.9 C) (Oral)   Wt 269 lb (122 kg)   SpO2 94%   BMI 36.48 kg/m    Subjective:    Patient ID: Kenneth Blair, male    DOB: 1960-05-07, 59 y.o.   MRN: [id number]  HPI: Kenneth Blair is a 59 y.o. male presenting on 07/02/2019 for comprehensive medical examination. Current medical complaints include:  HYPERTENSION / HYPERLIPIDEMIA Satisfied with current treatment? yes Duration of hypertension: chronic BP monitoring frequency: not checking BP medication side effects: no Past BP meds: lisinopril-HCTZ, amlodipine Duration of hyperlipidemia: chronic Cholesterol medication side effects: N/A Cholesterol supplements: none Past cholesterol medications: none Medication compliance: excellent compliance Aspirin: yes Recent stressors: no Recurrent headaches: no Visual changes: no Palpitations: no Dyspnea: no Chest pain: no Lower extremity edema: no Dizzy/lightheaded: no  Interim Problems from his last visit: no  Depression Screen done today and results listed below:  Depression screen [facility] 2/9 07/02/2019 01/28/2017 01/07/2015  Decreased Interest 0 0 0  Down, Depressed, Hopeless 0 0 0  PHQ - 2 Score 0 0 0  Altered sleeping 0 0 -  Tired, decreased energy 0 0 -  Change in appetite 0 0 -  Feeling bad or failure about yourself  0 0 -  Trouble concentrating 0 0 -  Moving slowly or fidgety/restless 0 0 -  Suicidal thoughts 0 0 -  PHQ-9 Score 0 0 -  Difficult doing work/chores Not difficult at all - -    Past Medical History:  Past Medical History:  Diagnosis Date  . Head ache   . History of knee problem   . Hyperlipidemia   . Hypertension   . Problems with hearing   . Reflux     Surgical History:  Past Surgical History:  Procedure Laterality Date  . KNEE SURGERY Left     Medications:  Current Outpatient Medications on File Prior to Visit  Medication Sig  . aspirin 81 MG tablet  Take 81 mg by mouth daily.  . famotidine (PEPCID) 20 MG tablet Take 20 mg by mouth daily.   . Multiple Vitamin (MULTI-VITAMINS) TABS Take 1 tablet by mouth daily.    No current facility-administered medications on file prior to visit.    Allergies:  Allergies  Allergen Reactions  . Shellfish Allergy Swelling    All sea food    Social History:  Social History   Socioeconomic History  . Marital status: Married    Spouse name: Not on file  . Number of children: Not on file  . Years of education: Not on file  . Highest education level: Not on file  Occupational History  . Not on file  Tobacco Use  . Smoking status: Current Every Day Smoker    Packs/day: 1.00    Years: 35.00    Pack years: 35.00    Types: Cigarettes  . Smokeless tobacco: Never Used  Substance and Sexual Activity  . Alcohol use: Yes    Alcohol/week: 3.0 standard drinks    Types: 3 Cans of beer per week    Comment: weekends  . Drug use: No  . Sexual activity: Yes  Other Topics Concern  . Not on file  Social History Narrative  . Not on file   Social Determinants of Health   Financial Resource Strain:   . Difficulty of Paying Living Expenses:   Food Insecurity:   . Worried  About Running Out of Food in the Last Year:   . Ran Out of Food in the Last Year:   Transportation Needs:   . Lack of Transportation (Medical):   Marland Kitchen. Lack of Transportation (Non-Medical):   Physical Activity:   . Days of Exercise per Week:   . Minutes of Exercise per Session:   Stress:   . Feeling of Stress :   Social Connections:   . Frequency of Communication with Friends and Family:   . Frequency of Social Gatherings with Friends and Family:   . Attends Religious Services:   . Active Member of Clubs or Organizations:   . Attends BankerClub or Organization Meetings:   Marland Kitchen. Marital Status:   Intimate Partner Violence:   . Fear of Current or Ex-Partner:   . Emotionally Abused:   Marland Kitchen. Physically Abused:   . Sexually Abused:    Social  History   Tobacco Use  Smoking Status Current Every Day Smoker  . Packs/day: 1.00  . Years: 35.00  . Pack years: 35.00  . Types: Cigarettes  Smokeless Tobacco Never Used   Social History   Substance and Sexual Activity  Alcohol Use Yes  . Alcohol/week: 3.0 standard drinks  . Types: 3 Cans of beer per week   Comment: weekends    Family History:  Family History  Problem Relation Age of Onset  . Heart disease Mother   . Hypertension Mother   . Stroke Mother   . Asthma Father   . Heart disease Father   . Hypertension Father   . Cancer Brother        Bone  . Hypertension Brother   . Hypertension Son     Past medical history, surgical history, medications, allergies, family history and social history reviewed with patient today and changes made to appropriate areas of the chart.   Review of Systems  Constitutional: Negative.   HENT: Negative.   Eyes: Negative.   Respiratory: Negative.   Cardiovascular: Negative.   Gastrointestinal: Negative.   Genitourinary: Negative.   Musculoskeletal: Negative.   Skin: Negative.   Neurological: Negative.   Endo/Heme/Allergies: Negative.   Psychiatric/Behavioral: Negative.     All other ROS negative except what is listed above and in the HPI.      Objective:    BP (!) 130/58 (BP Location: Left Arm, Cuff Size: Large)   Pulse 82   Temp 98.5 F (36.9 C) (Oral)   Wt 269 lb (122 kg)   SpO2 94%   BMI 36.48 kg/m   Wt Readings from Last 3 Encounters:  07/02/19 269 lb (122 kg)  11/14/18 271 lb (122.9 kg)  04/04/18 266 lb (120.7 kg)    Physical Exam Vitals and nursing note reviewed.  Constitutional:      General: He is not in acute distress.    Appearance: Normal appearance. He is obese. He is not ill-appearing, toxic-appearing or diaphoretic.  HENT:     Head: Normocephalic and atraumatic.     Right Ear: Tympanic membrane, ear canal and external ear normal. There is no impacted cerumen.     Left Ear: Tympanic membrane,  ear canal and external ear normal. There is no impacted cerumen.     Nose: Nose normal. No congestion or rhinorrhea.     Mouth/Throat:     Mouth: Mucous membranes are moist.     Pharynx: Oropharynx is clear. No oropharyngeal exudate or posterior oropharyngeal erythema.  Eyes:     General: No scleral icterus.  Right eye: No discharge.        Left eye: No discharge.     Extraocular Movements: Extraocular movements intact.     Conjunctiva/sclera: Conjunctivae normal.     Pupils: Pupils are equal, round, and reactive to light.  Neck:     Vascular: No carotid bruit.  Cardiovascular:     Rate and Rhythm: Normal rate and regular rhythm.     Pulses: Normal pulses.     Heart sounds: No murmur. No friction rub. No gallop.   Pulmonary:     Effort: Pulmonary effort is normal. No respiratory distress.     Breath sounds: Normal breath sounds. No stridor. No wheezing, rhonchi or rales.  Chest:     Chest wall: No tenderness.  Abdominal:     General: Abdomen is flat. Bowel sounds are normal. There is no distension.     Palpations: Abdomen is soft. There is no mass.     Tenderness: There is no abdominal tenderness. There is no right CVA tenderness, left CVA tenderness, guarding or rebound.     Hernia: No hernia is present.  Genitourinary:    Comments: Genital exam deferred with shared decision making Musculoskeletal:        General: No swelling, tenderness, deformity or signs of injury.     Cervical back: Normal range of motion and neck supple. No rigidity. No muscular tenderness.     Right lower leg: No edema.     Left lower leg: No edema.  Lymphadenopathy:     Cervical: No cervical adenopathy.  Skin:    General: Skin is warm and dry.     Capillary Refill: Capillary refill takes less than 2 seconds.     Coloration: Skin is not jaundiced or pale.     Findings: No bruising, erythema, lesion or rash.  Neurological:     General: No focal deficit present.     Mental Status: He is alert and  oriented to person, place, and time.     Cranial Nerves: No cranial nerve deficit.     Sensory: No sensory deficit.     Motor: No weakness.     Coordination: Coordination normal.     Gait: Gait normal.     Deep Tendon Reflexes: Reflexes normal.  Psychiatric:        Mood and Affect: Mood normal.        Behavior: Behavior normal.        Thought Content: Thought content normal.        Judgment: Judgment normal.     Results for orders placed or performed in visit on 11/14/18  Comprehensive metabolic panel  Result Value Ref Range   Glucose 107 (H) 65 - 99 mg/dL   BUN 12 6 - 24 mg/dL   Creatinine, Ser 1.08 0.76 - 1.27 mg/dL   GFR calc non Af Amer 75 >59 mL/min/1.73   GFR calc Af Amer 87 >59 mL/min/1.73   BUN/Creatinine Ratio 11 9 - 20   Sodium 141 134 - 144 mmol/L   Potassium 3.9 3.5 - 5.2 mmol/L   Chloride 103 96 - 106 mmol/L   CO2 25 20 - 29 mmol/L   Calcium 9.4 8.7 - 10.2 mg/dL   Total Protein 6.5 6.0 - 8.5 g/dL   Albumin 4.4 3.8 - 4.9 g/dL   Globulin, Total 2.1 1.5 - 4.5 g/dL   Albumin/Globulin Ratio 2.1 1.2 - 2.2   Bilirubin Total 0.2 0.0 - 1.2 mg/dL   Alkaline Phosphatase 83 39 -  117 IU/L   AST 10 0 - 40 IU/L   ALT 10 0 - 44 IU/L  Lipid Panel w/o Chol/HDL Ratio  Result Value Ref Range   Cholesterol, Total 185 100 - 199 mg/dL   Triglycerides 161 (H) 0 - 149 mg/dL   HDL 38 (L) >09 mg/dL   VLDL Cholesterol Cal 36 5 - 40 mg/dL   LDL Chol Calc (NIH) 604 (H) 0 - 99 mg/dL      Assessment & Plan:   Problem List Items Addressed This Visit      Cardiovascular and Mediastinum   Hypertension    Under good control on current regimen. Continue current regimen. Continue to monitor. Call with any concerns. Refills given. Labs drawn today.       Relevant Medications   amLODipine (NORVASC) 10 MG tablet   lisinopril-hydrochlorothiazide (ZESTORETIC) 20-25 MG tablet   Other Relevant Orders   CBC with Differential/Platelet   Comprehensive metabolic panel   Microalbumin, Urine  Waived   TSH     Respiratory   COPD, mild (HCC)    Lungs clear. Stable on albuterol. Continue to monitor. Call with any concerns. Refills given.       Relevant Medications   albuterol (VENTOLIN HFA) 108 (90 Base) MCG/ACT inhaler   fluticasone (FLONASE) 50 MCG/ACT nasal spray   Other Relevant Orders   CBC with Differential/Platelet   Comprehensive metabolic panel     Other   Hyperlipidemia    Under good control on current regimen. Continue current regimen. Continue to monitor. Call with any concerns. Refills given. Labs drawn today.      Relevant Medications   amLODipine (NORVASC) 10 MG tablet   lisinopril-hydrochlorothiazide (ZESTORETIC) 20-25 MG tablet   Other Relevant Orders   CBC with Differential/Platelet   Comprehensive metabolic panel   Lipid Panel w/o Chol/HDL Ratio   Tobacco abuse    Not interested in quitting right now. Continue to monitor.       Relevant Orders   CBC with Differential/Platelet   Comprehensive metabolic panel   UA/M w/rflx Culture, Routine   Morbid obesity (HCC)    Due to HTN, HLD and knee arthritis. Encouraged diet and exercise with the goal of losing 1-2lbs per week. Call with any concerns. Continue to monitor.        Other Visit Diagnoses    Routine general medical examination at a health care facility    -  Primary   Vaccines declined. Screening labs checked today. Colonoscopy up to date. Continue diet and exercise. Call with any concerns.    Relevant Orders   CBC with Differential/Platelet   Comprehensive metabolic panel   Lipid Panel w/o Chol/HDL Ratio   Microalbumin, Urine Waived   PSA   TSH   UA/M w/rflx Culture, Routine   Screening for prostate cancer       Labs drawn today. Await results.    Relevant Orders   PSA       Discussed aspirin prophylaxis for myocardial infarction prevention and decision was made to continue ASA  LABORATORY TESTING:  Health maintenance labs ordered today as discussed above.   The natural  history of prostate cancer and ongoing controversy regarding screening and potential treatment outcomes of prostate cancer has been discussed with the patient. The meaning of a false positive PSA and a false negative PSA has been discussed. He indicates understanding of the limitations of this screening test and wishes to proceed with screening PSA testing.   IMMUNIZATIONS:   -  Tdap: Tetanus vaccination status reviewed: Will get next time, clinic out of stock. - Influenza: Postponed to flu season - Pneumovax: Up to date  SCREENING: - Colonoscopy: Up to date  Discussed with patient purpose of the colonoscopy is to detect colon cancer at curable precancerous or early stages   PATIENT COUNSELING:    Sexuality: Discussed sexually transmitted diseases, partner selection, use of condoms, avoidance of unintended pregnancy  and contraceptive alternatives.   Advised to avoid cigarette smoking.  I discussed with the patient that most people either abstain from alcohol or drink within safe limits (<=14/week and <=4 drinks/occasion for males, <=7/weeks and <= 3 drinks/occasion for females) and that the risk for alcohol disorders and other health effects rises proportionally with the number of drinks per week and how often a drinker exceeds daily limits.  Discussed cessation/primary prevention of drug use and availability of treatment for abuse.   Diet: Encouraged to adjust caloric intake to maintain  or achieve ideal body weight, to reduce intake of dietary saturated fat and total fat, to limit sodium intake by avoiding high sodium foods and not adding table salt, and to maintain adequate dietary potassium and calcium preferably from fresh fruits, vegetables, and low-fat dairy products.    stressed the importance of regular exercise  Injury prevention: Discussed safety belts, safety helmets, smoke detector, smoking near bedding or upholstery.   Dental health: Discussed importance of regular tooth  brushing, flossing, and dental visits.   Follow up plan: NEXT PREVENTATIVE PHYSICAL DUE IN 1 YEAR. Return in about 6 months (around 01/02/2020).

## 2019-07-03 ENCOUNTER — Encounter: Payer: Self-pay | Admitting: Family Medicine

## 2019-07-03 LAB — COMPREHENSIVE METABOLIC PANEL
ALT: 9 IU/L (ref 0–44)
AST: 14 IU/L (ref 0–40)
Albumin/Globulin Ratio: 2 (ref 1.2–2.2)
Albumin: 4.3 g/dL (ref 3.8–4.9)
Alkaline Phosphatase: 86 IU/L (ref 39–117)
BUN/Creatinine Ratio: 13 (ref 9–20)
BUN: 12 mg/dL (ref 6–24)
Bilirubin Total: 0.3 mg/dL (ref 0.0–1.2)
CO2: 23 mmol/L (ref 20–29)
Calcium: 9.7 mg/dL (ref 8.7–10.2)
Chloride: 103 mmol/L (ref 96–106)
Creatinine, Ser: 0.96 mg/dL (ref 0.76–1.27)
GFR calc Af Amer: 100 mL/min/{1.73_m2} (ref >59)
GFR calc non Af Amer: 87 mL/min/{1.73_m2} (ref >59)
Globulin, Total: 2.2 g/dL (ref 1.5–4.5)
Glucose: 134 mg/dL — ABNORMAL HIGH (ref 65–99)
Potassium: 3.7 mmol/L (ref 3.5–5.2)
Sodium: 142 mmol/L (ref 134–144)
Total Protein: 6.5 g/dL (ref 6.0–8.5)

## 2019-07-03 LAB — CBC WITH DIFFERENTIAL/PLATELET
Basophils Absolute: 0 10*3/uL (ref 0.0–0.2)
Basos: 1 %
EOS (ABSOLUTE): 0.2 10*3/uL (ref 0.0–0.4)
Eos: 3 %
Hematocrit: 44.8 % (ref 37.5–51.0)
Hemoglobin: 15.1 g/dL (ref 13.0–17.7)
Immature Grans (Abs): 0 10*3/uL (ref 0.0–0.1)
Immature Granulocytes: 0 %
Lymphocytes Absolute: 1.9 10*3/uL (ref 0.7–3.1)
Lymphs: 24 %
MCH: 32.9 pg (ref 26.6–33.0)
MCHC: 33.7 g/dL (ref 31.5–35.7)
MCV: 98 fL — ABNORMAL HIGH (ref 79–97)
Monocytes Absolute: 0.5 10*3/uL (ref 0.1–0.9)
Monocytes: 6 %
Neutrophils Absolute: 5.4 10*3/uL (ref 1.4–7.0)
Neutrophils: 66 %
Platelets: 188 10*3/uL (ref 150–450)
RBC: 4.59 x10E6/uL (ref 4.14–5.80)
RDW: 12.9 % (ref 11.6–15.4)
WBC: 8 10*3/uL (ref 3.4–10.8)

## 2019-07-03 LAB — TSH: TSH: 1.48 u[IU]/mL (ref 0.450–4.500)

## 2019-07-03 LAB — LIPID PANEL W/O CHOL/HDL RATIO
Cholesterol, Total: 181 mg/dL (ref 100–199)
HDL: 35 mg/dL — ABNORMAL LOW (ref >39)
LDL Chol Calc (NIH): 114 mg/dL — ABNORMAL HIGH (ref 0–99)
Triglycerides: 182 mg/dL — ABNORMAL HIGH (ref 0–149)
VLDL Cholesterol Cal: 32 mg/dL (ref 5–40)

## 2019-07-03 LAB — PSA: Prostate Specific Ag, Serum: 0.5 ng/mL (ref 0.0–4.0)

## 2019-11-11 ENCOUNTER — Ambulatory Visit (INDEPENDENT_AMBULATORY_CARE_PROVIDER_SITE_OTHER): Payer: BC Managed Care – PPO

## 2019-11-11 ENCOUNTER — Ambulatory Visit
Admission: EM | Admit: 2019-11-11 | Discharge: 2019-11-11 | Disposition: A | Discharge status: Home / Self Care | Payer: BC Managed Care – PPO | Attending: Emergency Medicine | Admitting: Emergency Medicine

## 2019-11-11 ENCOUNTER — Other Ambulatory Visit: Payer: Self-pay

## 2019-11-11 DIAGNOSIS — I10 Essential (primary) hypertension: Secondary | ICD-10-CM | POA: Diagnosis not present

## 2019-11-11 DIAGNOSIS — R05 Cough: Secondary | ICD-10-CM | POA: Diagnosis not present

## 2019-11-11 DIAGNOSIS — J189 Pneumonia, unspecified organism: Secondary | ICD-10-CM | POA: Diagnosis not present

## 2019-11-11 DIAGNOSIS — J449 Chronic obstructive pulmonary disease, unspecified: Secondary | ICD-10-CM | POA: Diagnosis not present

## 2019-11-11 DIAGNOSIS — Z79899 Other long term (current) drug therapy: Secondary | ICD-10-CM | POA: Insufficient documentation

## 2019-11-11 DIAGNOSIS — Z20822 Contact with and (suspected) exposure to covid-19: Secondary | ICD-10-CM | POA: Diagnosis not present

## 2019-11-11 DIAGNOSIS — Z8701 Personal history of pneumonia (recurrent): Secondary | ICD-10-CM | POA: Insufficient documentation

## 2019-11-11 DIAGNOSIS — R0602 Shortness of breath: Secondary | ICD-10-CM

## 2019-11-11 DIAGNOSIS — Z8249 Family history of ischemic heart disease and other diseases of the circulatory system: Secondary | ICD-10-CM | POA: Insufficient documentation

## 2019-11-11 DIAGNOSIS — Z7982 Long term (current) use of aspirin: Secondary | ICD-10-CM | POA: Insufficient documentation

## 2019-11-11 DIAGNOSIS — G4730 Sleep apnea, unspecified: Secondary | ICD-10-CM | POA: Diagnosis not present

## 2019-11-11 DIAGNOSIS — E785 Hyperlipidemia, unspecified: Secondary | ICD-10-CM | POA: Insufficient documentation

## 2019-11-11 DIAGNOSIS — F17210 Nicotine dependence, cigarettes, uncomplicated: Secondary | ICD-10-CM | POA: Diagnosis not present

## 2019-11-11 DIAGNOSIS — Z6835 Body mass index (BMI) 35.0-35.9, adult: Secondary | ICD-10-CM | POA: Diagnosis not present

## 2019-11-11 MED ORDER — AZITHROMYCIN 250 MG PO TABS: 250.0000 mg | ORAL_TABLET | Freq: Every day | ORAL | 0 refills | Status: DC

## 2019-11-11 NOTE — ED Triage Notes (Signed)
Patient states that he has been having a cough and shortness of breath x 4 days. States that he has had similar symptoms before when he had pneumonia. Patient reports that he has had negative covid testing 2 days ago.

## 2019-11-11 NOTE — Discharge Instructions (Addendum)
Take the antibiotic as directed.  Continue to use your albuterol inhaler as directed.    Go to the emergency department if you have acute shortness of breath or other concerning symptoms.    Your Covid test is pending.  You should self quarantine until the test result is back.    Follow-up with your primary care provider for a recheck this week.

## 2019-11-11 NOTE — ED Provider Notes (Signed)
[facility]    CSN: [id number] Arrival date & time: 11/11/19  1332      History   Chief Complaint Chief Complaint  Patient presents with  . Shortness of Breath    HPI Kenneth Blair is a 59 y.o. male.   Patient presents with shortness of breath and productive cough x4 days.  Patient states he has history of pneumonia and felt like this.  He denies fever, chills, sore throat, vomiting, diarrhea, or other symptoms.  He took an at home COVID test 2 days ago which was negative.  Patient smokes 1-1.5 packs of cigarettes daily x35 years.  The history is provided by the patient.    Past Medical History:  Diagnosis Date  . Head ache   . History of knee problem   . Hyperlipidemia   . Hypertension   . Problems with hearing   . Reflux     Patient Active Problem List   Diagnosis Date Noted  . Primary osteoarthritis of right knee 04/26/2018  . Tobacco abuse 01/09/2016  . Morbid obesity (HCC) 01/09/2016  . 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 Sleep apnea 05/02/2015  . Hyperlipidemia 01/03/2015  . Hypertension 01/03/2015  . Knee pain 01/03/2015    Past Surgical History:  Procedure Laterality Date  . KNEE SURGERY Left        Home Medications    Prior to Admission medications   Medication Sig Start Date End Date Taking? Authorizing Provider  albuterol (VENTOLIN HFA) 108 (90 Base) MCG/ACT inhaler Inhale 2 puffs into the lungs every 6 (six) hours as needed for wheezing or shortness of breath. 07/02/19  Yes Johnson, Megan P, DO  amLODipine (NORVASC) 10 MG tablet Take 1 tablet (10 mg total) by mouth daily. 07/02/19  Yes Johnson, Megan P, DO  aspirin 81 MG tablet Take 81 mg by mouth daily.   Yes [provider]  famotidine (PEPCID) 20 MG tablet Take 20 mg by mouth daily.    Yes [provider]  fluticasone (FLONASE) 50 MCG/ACT nasal spray Place 2 sprays into both nostrils daily. 07/02/19  Yes Johnson, Megan P, DO  lisinopril-hydrochlorothiazide (ZESTORETIC)  20-25 MG tablet Take 1 tablet by mouth daily. 07/02/19  Yes Johnson, Megan P, DO  Multiple Vitamin (MULTI-VITAMINS) TABS Take 1 tablet by mouth daily.    Yes [provider]  naproxen (NAPROSYN) 500 MG tablet Take 1 tablet (500 mg total) by mouth 2 (two) times daily with a meal. 07/02/19  Yes Johnson, Megan P, DO  azithromycin (ZITHROMAX) 250 MG tablet Take 1 tablet (250 mg total) by mouth daily. Take first 2 tablets together, then 1 every day until finished. 11/11/19   Mickie Bail, NP    Family History Family History  Problem Relation Age of Onset  . Heart disease Mother   . Hypertension Mother   . Stroke Mother   . Asthma Father   . Heart disease Father   . Hypertension Father   . Cancer Brother        Bone  . Hypertension Brother   . Hypertension Son     Social History Social History   Tobacco Use  . Smoking status: Current Every Day Smoker    Packs/day: 1.00    Years: 35.00    Pack years: 35.00    Types: Cigarettes  . Smokeless tobacco: Never Used  Vaping Use  . Vaping Use: Never used  Substance Use Topics  . Alcohol use: Yes    Alcohol/week:  3.0 standard drinks    Types: 3 Cans of beer per week    Comment: weekends  . Drug use: No     Allergies   Shellfish allergy   Review of Systems Review of Systems  Constitutional: Negative for chills and fever.  HENT: Negative for ear pain and sore throat.   Eyes: Negative for pain and visual disturbance.  Respiratory: Positive for cough and shortness of breath.   Cardiovascular: Negative for chest pain and palpitations.  Gastrointestinal: Negative for abdominal pain, diarrhea and vomiting.  Genitourinary: Negative for dysuria and hematuria.  Musculoskeletal: Negative for arthralgias and back pain.  Skin: Negative for color change and rash.  Neurological: Negative for seizures and syncope.  All other systems reviewed and are negative.    Physical Exam Triage Vital Signs ED Triage Vitals  Enc Vitals Group      BP 11/11/19 1425 129/89     Pulse Rate 11/11/19 1425 79     Resp 11/11/19 1425 (!) 28     Temp 11/11/19 1425 98.4 F (36.9 C)     Temp Source 11/11/19 1425 Oral     SpO2 11/11/19 1425 92 %     Weight 11/11/19 1424 259 lb (117.5 kg)     Height 11/11/19 1424 6' (1.829 m)     Head Circumference --      Peak Flow --      Pain Score 11/11/19 1423 8     Pain Loc --      Pain Edu? --      Excl. in GC? --    No data found.  Updated Vital Signs BP 129/89 (BP Location: Left Arm)   Pulse 79   Temp 98.4 F (36.9 C) (Oral)   Resp (!) 28   Ht 6' (1.829 m)   Wt 259 lb (117.5 kg)   SpO2 92%   BMI 35.13 kg/m   Visual Acuity Right Eye Distance:   Left Eye Distance:   Bilateral Distance:    Right Eye Near:   Left Eye Near:    Bilateral Near:     Physical Exam Vitals and nursing note reviewed.  Constitutional:      General: He is not in acute distress.    Appearance: He is well-developed.  HENT:     Head: Normocephalic and atraumatic.     Mouth/Throat:     Mouth: Mucous membranes are moist.  Eyes:     Conjunctiva/sclera: Conjunctivae normal.  Cardiovascular:     Rate and Rhythm: Normal rate and regular rhythm.     Heart sounds: No murmur heard.   Pulmonary:     Effort: Pulmonary effort is normal. No respiratory distress.     Breath sounds: Rhonchi present.     Comments: Scattered rhonchi throughout. Abdominal:     Palpations: Abdomen is soft.     Tenderness: There is no abdominal tenderness.  Musculoskeletal:     Cervical back: Neck supple.  Skin:    General: Skin is warm and dry.     Findings: No rash.  Neurological:     General: No focal deficit present.     Mental Status: He is alert and oriented to person, place, and time.     Gait: Gait normal.  Psychiatric:        Mood and Affect: Mood normal.        Behavior: Behavior normal.      UC Treatments / Results  Labs (all labs ordered are listed, but only  abnormal results are displayed) Labs Reviewed    SARS CORONAVIRUS 2 (TAT 6-24 HRS)    EKG   Radiology DG Chest 2 View  Result Date: 11/11/2019 CLINICAL DATA:  Cough and shortness of breath for 4 days, similar symptoms with prior pneumonia, negative COVID testing 2 days prior EXAM: CHEST - 2 VIEW COMPARISON:  None. FINDINGS: Faint opacity is present in the right mid lung on frontal radiograph is poorly delineated on the lateral radiograph, possibly within the right lower lobe. Some mild airways thickening is present as well. Pulmonary vascularity is well-defined. No pneumothorax. No visible effusion. Borderline cardiomegaly. No worrisome adenopathy. Remaining cardiomediastinal contours are unremarkable. No acute osseous or soft tissue abnormality. Degenerative changes are present in the imaged spine and shoulders. IMPRESSION: Airways thickening and faint opacity in the right mid lung on frontal radiograph which is poorly delineated on the lateral radiograph but possibly within the right lower lobe, could reflect early pneumonia. Electronically Signed   By: Kreg Shropshire M.D.   On: 11/11/2019 15:05    Procedures Procedures (including critical care time)  Medications Ordered in UC Medications - No data to display  Initial Impression / Assessment and Plan / UC Course  I have reviewed the triage vital signs and the nursing notes.  Pertinent labs & imaging results that were available during my care of the patient were reviewed by me and considered in my medical decision making (see chart for details).    Shortness of breath, right lower lobe pneumonia.  CXR shows "Airways thickening and faint opacity in the right mid lung on frontal radiograph which is poorly delineated on the lateral radiograph but possibly within the right lower lobe, could reflect early pneumonia."  Treating with Zithromax and continued use of his albuterol inhaler.  PCR COVID pending.  Instructed patient to self quarantine until the test result is back.  Instructed him to go  to the emergency department if he has acute shortness of breath or other concerning symptoms.  Instructed him to follow-up with his PCP for recheck this week.  Patient agrees to plan of care.   Final Clinical Impressions(s) / UC Diagnoses   Final diagnoses:  SOB (shortness of breath)  Community acquired pneumonia of right lower lobe of lung     Discharge Instructions     Take the antibiotic as directed.  Continue to use your albuterol inhaler as directed.    Go to the emergency department if you have acute shortness of breath or other concerning symptoms.    Your Covid test is pending.  You should self quarantine until the test result is back.    Follow-up with your primary care provider for a recheck this week.          ED Prescriptions    Medication Sig Dispense Auth. Provider   azithromycin (ZITHROMAX) 250 MG tablet Take 1 tablet (250 mg total) by mouth daily. Take first 2 tablets together, then 1 every day until finished. 6 tablet Mickie Bail, NP     PDMP not reviewed this encounter.   Mickie Bail, NP 11/11/19 [phone]

## 2019-11-12 LAB — SARS CORONAVIRUS 2 (TAT 6-24 HRS): SARS Coronavirus 2: NEGATIVE

## 2019-11-13 ENCOUNTER — Telehealth: Payer: Self-pay | Admitting: Family Medicine

## 2019-11-13 NOTE — Telephone Encounter (Signed)
Needs appt per Dr. Laural Benes if not getting better with Z-pack

## 2019-11-13 NOTE — Telephone Encounter (Signed)
Routing to provider to advise. UC note in chart review from 11/11/19

## 2019-11-13 NOTE — Telephone Encounter (Signed)
Appt if not getting better °

## 2019-11-13 NOTE — Telephone Encounter (Signed)
Copied from CRM [id number]. Topic: General - Other >> Nov 13, 2019  9:14 AM Dalphine Handing A wrote: Patient went to urgent care and has pneumonia and was given the zpak but feels it isnt assisting him. Patient would like a callback from nurse today as soon as possible.  Would he need a apt?

## 2019-11-14 ENCOUNTER — Ambulatory Visit (INDEPENDENT_AMBULATORY_CARE_PROVIDER_SITE_OTHER): Payer: BC Managed Care – PPO

## 2019-11-14 ENCOUNTER — Encounter: Payer: Self-pay | Admitting: Intensive Care

## 2019-11-14 ENCOUNTER — Other Ambulatory Visit: Payer: Self-pay

## 2019-11-14 ENCOUNTER — Ambulatory Visit: Payer: Self-pay

## 2019-11-14 ENCOUNTER — Ambulatory Visit (INDEPENDENT_AMBULATORY_CARE_PROVIDER_SITE_OTHER)
Admission: EM | Admit: 2019-11-14 | Discharge: 2019-11-14 | Disposition: A | Discharge status: Other Acute Inpatient Hospital | Payer: BC Managed Care – PPO | Source: Home / Self Care | Attending: Emergency Medicine | Admitting: Emergency Medicine

## 2019-11-14 ENCOUNTER — Encounter: Payer: Self-pay | Admitting: Emergency Medicine

## 2019-11-14 ENCOUNTER — Inpatient Hospital Stay
Admission: EM | Admit: 2019-11-14 | Discharge: 2019-11-18 | DRG: 193 | Disposition: A | Discharge status: Home / Self Care | Payer: BC Managed Care – PPO | Attending: Internal Medicine | Admitting: Internal Medicine

## 2019-11-14 DIAGNOSIS — Z720 Tobacco use: Secondary | ICD-10-CM | POA: Diagnosis present

## 2019-11-14 DIAGNOSIS — Z825 Family history of asthma and other chronic lower respiratory diseases: Secondary | ICD-10-CM

## 2019-11-14 DIAGNOSIS — J441 Chronic obstructive pulmonary disease with (acute) exacerbation: Secondary | ICD-10-CM | POA: Diagnosis present

## 2019-11-14 DIAGNOSIS — G4730 Sleep apnea, unspecified: Secondary | ICD-10-CM | POA: Diagnosis present

## 2019-11-14 DIAGNOSIS — J189 Pneumonia, unspecified organism: Secondary | ICD-10-CM | POA: Diagnosis not present

## 2019-11-14 DIAGNOSIS — R059 Cough, unspecified: Secondary | ICD-10-CM

## 2019-11-14 DIAGNOSIS — J9601 Acute respiratory failure with hypoxia: Secondary | ICD-10-CM | POA: Diagnosis not present

## 2019-11-14 DIAGNOSIS — K219 Gastro-esophageal reflux disease without esophagitis: Secondary | ICD-10-CM

## 2019-11-14 DIAGNOSIS — F17210 Nicotine dependence, cigarettes, uncomplicated: Secondary | ICD-10-CM | POA: Diagnosis not present

## 2019-11-14 DIAGNOSIS — R05 Cough: Secondary | ICD-10-CM

## 2019-11-14 DIAGNOSIS — R0689 Other abnormalities of breathing: Secondary | ICD-10-CM | POA: Diagnosis not present

## 2019-11-14 DIAGNOSIS — G4733 Obstructive sleep apnea (adult) (pediatric): Secondary | ICD-10-CM | POA: Diagnosis not present

## 2019-11-14 DIAGNOSIS — R0902 Hypoxemia: Secondary | ICD-10-CM | POA: Diagnosis not present

## 2019-11-14 DIAGNOSIS — I10 Essential (primary) hypertension: Secondary | ICD-10-CM | POA: Diagnosis present

## 2019-11-14 DIAGNOSIS — Z7982 Long term (current) use of aspirin: Secondary | ICD-10-CM | POA: Diagnosis not present

## 2019-11-14 DIAGNOSIS — R0602 Shortness of breath: Secondary | ICD-10-CM | POA: Diagnosis not present

## 2019-11-14 DIAGNOSIS — Z792 Long term (current) use of antibiotics: Secondary | ICD-10-CM

## 2019-11-14 DIAGNOSIS — Z791 Long term (current) use of non-steroidal anti-inflammatories (NSAID): Secondary | ICD-10-CM

## 2019-11-14 DIAGNOSIS — Z20822 Contact with and (suspected) exposure to covid-19: Secondary | ICD-10-CM | POA: Diagnosis not present

## 2019-11-14 DIAGNOSIS — R069 Unspecified abnormalities of breathing: Secondary | ICD-10-CM | POA: Diagnosis not present

## 2019-11-14 DIAGNOSIS — J440 Chronic obstructive pulmonary disease with acute lower respiratory infection: Secondary | ICD-10-CM | POA: Diagnosis not present

## 2019-11-14 DIAGNOSIS — E785 Hyperlipidemia, unspecified: Secondary | ICD-10-CM | POA: Diagnosis present

## 2019-11-14 DIAGNOSIS — Z79899 Other long term (current) drug therapy: Secondary | ICD-10-CM | POA: Diagnosis not present

## 2019-11-14 DIAGNOSIS — Z8261 Family history of arthritis: Secondary | ICD-10-CM | POA: Diagnosis not present

## 2019-11-14 DIAGNOSIS — J90 Pleural effusion, not elsewhere classified: Secondary | ICD-10-CM | POA: Diagnosis not present

## 2019-11-14 DIAGNOSIS — Z823 Family history of stroke: Secondary | ICD-10-CM

## 2019-11-14 DIAGNOSIS — Z8249 Family history of ischemic heart disease and other diseases of the circulatory system: Secondary | ICD-10-CM

## 2019-11-14 LAB — CBC
HCT: 40.3 % (ref 39.0–52.0)
Hemoglobin: 14.9 g/dL (ref 13.0–17.0)
MCH: 33.3 pg (ref 26.0–34.0)
MCHC: 37 g/dL — ABNORMAL HIGH (ref 30.0–36.0)
MCV: 90 fL (ref 80.0–100.0)
Platelets: 236 10*3/uL (ref 150–400)
RBC: 4.48 MIL/uL (ref 4.22–5.81)
RDW: 12.6 % (ref 11.5–15.5)
WBC: 11.5 10*3/uL — ABNORMAL HIGH (ref 4.0–10.5)
nRBC: 0 % (ref 0.0–0.2)

## 2019-11-14 LAB — TROPONIN I (HIGH SENSITIVITY)
Troponin I (High Sensitivity): 6 ng/L (ref <18)
Troponin I (High Sensitivity): 4 ng/L (ref <18)

## 2019-11-14 LAB — BASIC METABOLIC PANEL
Anion gap: 10 (ref 5–15)
BUN: 16 mg/dL (ref 6–20)
CO2: 26 mmol/L (ref 22–32)
Calcium: 9.2 mg/dL (ref 8.9–10.3)
Chloride: 98 mmol/L (ref 98–111)
Creatinine, Ser: 0.91 mg/dL (ref 0.61–1.24)
GFR calc Af Amer: >60 mL/min (ref >60)
GFR calc non Af Amer: >60 mL/min (ref >60)
Glucose, Bld: 110 mg/dL — ABNORMAL HIGH (ref 70–99)
Potassium: 3.7 mmol/L (ref 3.5–5.1)
Sodium: 134 mmol/L — ABNORMAL LOW (ref 135–145)

## 2019-11-14 LAB — LACTIC ACID, PLASMA: Lactic Acid, Venous: 1.9 mmol/L (ref 0.5–1.9)

## 2019-11-14 LAB — SARS CORONAVIRUS 2 (TAT 6-24 HRS): SARS Coronavirus 2: NEGATIVE

## 2019-11-14 MED ORDER — IPRATROPIUM-ALBUTEROL 0.5-2.5 (3) MG/3ML IN SOLN
3.0000 mL | Freq: Once | RESPIRATORY_TRACT | Status: AC
  Administered 2019-11-14: 3 mL via RESPIRATORY_TRACT
  Filled 2019-11-14: qty 3

## 2019-11-14 MED ORDER — SODIUM CHLORIDE 0.9 % IV SOLN
2.0000 g | Freq: Once | INTRAVENOUS | Status: AC
  Administered 2019-11-14: 2 g via INTRAVENOUS
  Filled 2019-11-14: qty 2

## 2019-11-14 MED ORDER — SODIUM CHLORIDE 0.9 % IV SOLN
500.0000 mg | Freq: Once | INTRAVENOUS | Status: AC
  Administered 2019-11-15: 500 mg via INTRAVENOUS
  Filled 2019-11-14: qty 500

## 2019-11-14 MED ORDER — METHYLPREDNISOLONE SODIUM SUCC 125 MG IJ SOLR
60.0000 mg | Freq: Once | INTRAMUSCULAR | Status: AC
  Administered 2019-11-14: 60 mg via INTRAVENOUS
  Filled 2019-11-14: qty 2

## 2019-11-14 MED ORDER — LACTATED RINGERS IV SOLN: INTRAVENOUS | Status: DC

## 2019-11-14 NOTE — Progress Notes (Signed)
PHARMACY -  BRIEF ANTIBIOTIC NOTE   Pharmacy has received consult(s) for Cefepime from an ED provider.  The patient's profile has been reviewed for ht/wt/allergies/indication/available labs.    One time order(s) placed for Cefepime 2gm  Further antibiotics/pharmacy consults should be ordered by admitting physician if indicated.                       Thank you, Nino, Amano A 11/14/2019  11:17 PM

## 2019-11-14 NOTE — H&P (Signed)
History and Physical    Kenneth Blair UVO:[id number] DOB: 08-21-1960 DOA: 11/14/2019  PCP: Dorcas Carrow, DO  Patient coming from: Home  I have personally briefly reviewed patient's old medical records in [facility]  Chief Complaint: Dyspnea  HPI: Kenneth Blair is a 59 y.o. male with medical history significant for COPD, HTN, HLD, tobacco use, and OSA on CPAP who presents to the ED for evaluation of progressive dyspnea.  Patient initially went to urgent care on 11/11/2019 for evaluation of 4 days shortness of breath and productive cough similar to previous pneumonia.  CXR was obtained and showed possible right lower lobe pneumonia.  He was started on empiric azithromycin which she has since finished.  COVID-19 PCR test obtained on 9/12 resulted negative.  He returned to urgent care earlier today 9/15 due to persistent and worsening symptoms.  He has had increased dyspnea and cough productive of clear sputum.  He has not had any relief with the course of azithromycin or his home albuterol inhaler which she has been using up to 4 times per day.  He has had associated chest wall discomfort due to frequent coughing.  He denies any subjective fevers, chills, diaphoresis, nausea, vomiting, abdominal pain, dysuria, diarrhea, or peripheral edema.  Repeat chest x-ray showed ill-defined airspace opacity in the left upper lobe.  Repeat SARS-CoV-2 PCR was negative 9/15.  He was noted to have hypoxia with SPO2 88% on room air and was therefore sent to the ED for further evaluation and management.  Patient does report chronic tobacco use, smoking 1-1.5 PPD since the age of 43.  He uses a CPAP nightly for OSA.  ED Course:  Initial vitals showed BP 154/81, pulse 82, RR 28, temp 99.3 Fahrenheit, SPO2 94% on 2 L supplemental O2 via [place].  Patient O2 saturation 88% on room air per ED documentation.  Labs show WBC 11.5, hemoglobin 14.9, platelets 236,000, sodium 134, potassium 3.7, bicarb 26, BUN  16, creatinine 0.91, serum glucose 110, high-sensitivity troponin I 6 > 4.  SARS-CoV-2 PCR is negative.  Blood cultures were obtained and pending.  2 view chest x-ray obtained in urgent care showed ill-defined left upper lobe airspace opacity.  Patient was given IV Solu-Medrol 60 mg, IV cefepime and azithromycin, and DuoNeb treatments x2.  He was started on maintenance IV fluids.  The hospitalist service was consulted to admit for further evaluation and management.  Review of Systems: All systems reviewed and are negative except as documented in history of present illness above.   Past Medical History:  Diagnosis Date  . Head ache   . History of knee problem   . Hyperlipidemia   . Hypertension   . Problems with hearing   . Reflux     Past Surgical History:  Procedure Laterality Date  . KNEE SURGERY Left     Social History:  reports that he has been smoking cigarettes. He has a 35.00 pack-year smoking history. He has never used smokeless tobacco. He reports current alcohol use of about 3.0 standard drinks of alcohol per week. He reports that he does not use drugs.  Allergies  Allergen Reactions  . Shellfish Allergy Swelling    All sea food    Family History  Problem Relation Age of Onset  . Heart disease Mother   . Hypertension Mother   . Stroke Mother   . Asthma Father   . Heart disease Father   . Hypertension Father   . Cancer Brother  Bone  . Hypertension Brother   . Hypertension Son      Prior to Admission medications   Medication Sig Start Date End Date Taking? Authorizing Provider  albuterol (VENTOLIN HFA) 108 (90 Base) MCG/ACT inhaler Inhale 2 puffs into the lungs every 6 (six) hours as needed for wheezing or shortness of breath. 07/02/19   Johnson, Megan P, DO  amLODipine (NORVASC) 10 MG tablet Take 1 tablet (10 mg total) by mouth daily. 07/02/19   Johnson, Megan P, DO  aspirin 81 MG tablet Take 81 mg by mouth daily.    [provider]    azithromycin (ZITHROMAX) 250 MG tablet Take 1 tablet (250 mg total) by mouth daily. Take first 2 tablets together, then 1 every day until finished. 11/11/19   Mickie Bail, NP  famotidine (PEPCID) 20 MG tablet Take 20 mg by mouth daily.     [provider]  fluticasone (FLONASE) 50 MCG/ACT nasal spray Place 2 sprays into both nostrils daily. 07/02/19   Johnson, Megan P, DO  lisinopril-hydrochlorothiazide (ZESTORETIC) 20-25 MG tablet Take 1 tablet by mouth daily. 07/02/19   Olevia Perches P, DO  Multiple Vitamin (MULTI-VITAMINS) TABS Take 1 tablet by mouth daily.     [provider]  naproxen (NAPROSYN) 500 MG tablet Take 1 tablet (500 mg total) by mouth 2 (two) times daily with a meal. 07/02/19   Dorcas Carrow, DO    Physical Exam: Vitals:   11/14/19 1345 11/14/19 1606 11/14/19 1815 11/14/19 2300  BP: (!) 154/81 124/70 (!) 123/48 (!) 138/59  Pulse: 82 67 74 72  Resp: (!) 28 (!) 26 (!) 28 (!) 24  Temp: 99.3 F (37.4 C) 98.4 F (36.9 C) 98.2 F (36.8 C)   TempSrc: Oral Oral Oral   SpO2: 94% 93% 96% 91%  Weight: 117.5 kg     Height: 6' (1.829 m)      Constitutional: Sitting up in bed, NAD, calm, comfortable Eyes: PERRL, lids and conjunctivae normal ENMT: Mucous membranes are moist. Posterior pharynx clear of any exudate or lesions.Normal dentition.  Neck: normal, supple, no masses. Respiratory: Coarse expiratory wheezing diffusely.  Frequent coughing with deep inspiration.  Normal respiratory effort. No accessory muscle use.  Cardiovascular: Regular rate and rhythm, no murmurs / rubs / gallops. No extremity edema. 2+ pedal pulses. Abdomen: no tenderness, no masses palpated. No hepatosplenomegaly. Bowel sounds positive.  Musculoskeletal: no clubbing / cyanosis. No joint deformity upper and lower extremities. Good ROM, no contractures. Normal muscle tone.  Skin: no rashes, lesions, ulcers. No induration Neurologic: CN 2-12 grossly intact. Sensation intact,Strength 5/5 in  all 4.  Psychiatric: Normal judgment and insight. Alert and oriented x 3. Normal mood.   Labs on Admission: I have personally reviewed following labs and imaging studies  CBC: Recent Labs  Lab 11/14/19 1346  WBC 11.5*  HGB 14.9  HCT 40.3  MCV 90.0  PLT 236   Basic Metabolic Panel: Recent Labs  Lab 11/14/19 1346  NA 134*  K 3.7  CL 98  CO2 26  GLUCOSE 110*  BUN 16  CREATININE 0.91  CALCIUM 9.2   GFR: Estimated Creatinine Clearance: 115.7 mL/min (by C-G formula based on SCr of 0.91 mg/dL). Liver Function Tests: No results for input(s): AST, ALT, ALKPHOS, BILITOT, PROT, ALBUMIN in the last 168 hours. No results for input(s): LIPASE, AMYLASE in the last 168 hours. No results for input(s): AMMONIA in the last 168 hours. Coagulation Profile: No results for input(s): INR, PROTIME in  the last 168 hours. Cardiac Enzymes: No results for input(s): CKTOTAL, CKMB, CKMBINDEX, TROPONINI in the last 168 hours. BNP (last 3 results) No results for input(s): PROBNP in the last 8760 hours. HbA1C: No results for input(s): HGBA1C in the last 72 hours. CBG: No results for input(s): GLUCAP in the last 168 hours. Lipid Profile: No results for input(s): CHOL, HDL, LDLCALC, TRIG, CHOLHDL, LDLDIRECT in the last 72 hours. Thyroid Function Tests: No results for input(s): TSH, T4TOTAL, FREET4, T3FREE, THYROIDAB in the last 72 hours. Anemia Panel: No results for input(s): VITAMINB12, FOLATE, FERRITIN, TIBC, IRON, RETICCTPCT in the last 72 hours. Urine analysis:    Component Value Date/Time   APPEARANCEUR Clear 07/02/2019 0841   GLUCOSEU Negative 07/02/2019 0841   BILIRUBINUR Negative 07/02/2019 0841   PROTEINUR Negative 07/02/2019 0841   NITRITE Negative 07/02/2019 0841   LEUKOCYTESUR Negative 07/02/2019 0841    Radiological Exams on Admission: DG Chest 2 View  Result Date: 11/14/2019 CLINICAL DATA:  Shortness of breath and cough EXAM: CHEST - 2 VIEW COMPARISON:  November 11, 2019  FINDINGS: There is ill-defined airspace opacity in the left upper lobe. Lungs otherwise are clear. Heart size and pulmonary vascularity are normal. No adenopathy. No bone lesions. IMPRESSION: Ill-defined airspace opacity left upper lobe without consolidation. This focus is consistent with pneumonia, potentially of atypical organism etiology. In this regard, advise check of COVID-19 status. Lungs elsewhere clear. Heart size and pulmonary vascularity normal. No adenopathy. Electronically Signed   By: Bretta Bang III M.D.   On: 11/14/2019 11:53    EKG: Independently reviewed. Normal sinus rhythm without acute ischemic changes.  Not significantly changed when compared to prior.  Assessment/Plan Principal Problem:   Acute respiratory failure with hypoxia (HCC) Active Problems:   Hyperlipidemia   Hypertension   Sleep apnea   Tobacco abuse   COPD with acute exacerbation (HCC)  Kenneth Blair is a 59 y.o. male with medical history significant for COPD, HTN, HLD, tobacco use, and OSA on CPAP who is admitted with acute respiratory failure with hypoxia.  Acute respiratory failure with hypoxia due to community-acquired pneumonia: Patient with documented O2 saturation down to 88%.  CXR with ill-defined left upper lobe opacity on admission.  SARS-CoV-2 PCR is negative 9/12 and 9/15. -Continue IV ceftriaxone and azithromycin -Continue supplemental oxygen as needed and wean as able -Obtain CT chest for further pulmonary evaluation given history of long-term tobacco use -Follow blood cultures, strep pneumonia urinary antigen  COPD with acute exacerbation: Likely triggered from pneumonia.  Has diffuse wheezing on admission. -Continue scheduled duo nebs, as needed albuterol -IV Solu-Medrol 40 mg twice daily -Supplemental oxygen as needed  Hypertension: Resume home amlodipine, lisinopril-HCTZ.  Hyperlipidemia: Not currently on statin therapy.  Tobacco use: Reports smoking 1-1.5 PPD since  age of 60.  Has not smoked for the last 1.5 weeks since symptom onset.  OSA: Continue CPAP nightly.  DVT prophylaxis: Lovenox Code Status: Full code, confirmed with patient Family Communication: Discussed with patient, he has discussed with family Disposition Plan: From home and likely discharge to home pending symptomatic improvement and ability to wean off oxygen Consults called: None Admission status:  Status is: Observation  The patient remains OBS appropriate and will d/c before 2 midnights.  Dispo: The patient is from: Home              Anticipated d/c is to: Home              Anticipated d/c date is: 1  day              Patient currently is not medically stable to d/c.  Darreld McleanVishal Lester Platas MD [facility]  If 7PM-7AM, please contact night-coverage [email or web address]  11/15/2019, 12:00 AM

## 2019-11-14 NOTE — Discharge Instructions (Addendum)
You were seen at the Urgent Care for shortness of breath. We are sending your to the emergency department for further evaluation.   Dr. Rachael Darby

## 2019-11-14 NOTE — ED Provider Notes (Signed)
[facility] Emergency Department Provider Note    First MD Initiated Contact with Patient 11/14/19 2219     (approximate)  I have reviewed the triage vital signs and the nursing notes.   HISTORY  Chief Complaint Shortness of Breath and Cough    HPI Kenneth Blair is a 59 y.o. male with the below listed past medical history presents to the ER for several days of progressively worsening cough exertional dyspnea and weakness.  Was recently treated for community acquired pneumonia with a course of Z-Pak but felt like he was not improving.  Felt like he is becoming more short of breath today so he followed up in urgent care was found that he was hypoxic requiring supplemental oxygen.  Repeat chest x-ray did show interval development worsening infiltrates concerning for pneumonia.  His repeat Covid today was negative.  Denies any nausea or vomiting.  Denies any pain.  Does not currently smoke but does have a history of the same.  has never been diagnosed with COPD or emphysema.    Past Medical History:  Diagnosis Date  . Head ache   . History of knee problem   . Hyperlipidemia   . Hypertension   . Problems with hearing   . Reflux    Family History  Problem Relation Age of Onset  . Heart disease Mother   . Hypertension Mother   . Stroke Mother   . Asthma Father   . Heart disease Father   . Hypertension Father   . Cancer Brother        Bone  . Hypertension Brother   . Hypertension Son    Past Surgical History:  Procedure Laterality Date  . KNEE SURGERY Left    Patient Active Problem List   Diagnosis Date Noted  . Primary osteoarthritis of right knee 04/26/2018  . Tobacco abuse 01/09/2016  . Morbid obesity (HCC) 01/09/2016  . 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 Sleep apnea 05/02/2015  . Hyperlipidemia 01/03/2015  . Hypertension 01/03/2015  . Knee pain 01/03/2015      Prior to Admission medications   Medication Sig Start Date End Date Taking?  Authorizing Provider  albuterol (VENTOLIN HFA) 108 (90 Base) MCG/ACT inhaler Inhale 2 puffs into the lungs every 6 (six) hours as needed for wheezing or shortness of breath. 07/02/19   Johnson, Megan P, DO  amLODipine (NORVASC) 10 MG tablet Take 1 tablet (10 mg total) by mouth daily. 07/02/19   Johnson, Megan P, DO  aspirin 81 MG tablet Take 81 mg by mouth daily.    [provider]  azithromycin (ZITHROMAX) 250 MG tablet Take 1 tablet (250 mg total) by mouth daily. Take first 2 tablets together, then 1 every day until finished. 11/11/19   Mickie Bail, NP  famotidine (PEPCID) 20 MG tablet Take 20 mg by mouth daily.     [provider]  fluticasone (FLONASE) 50 MCG/ACT nasal spray Place 2 sprays into both nostrils daily. 07/02/19   Johnson, Megan P, DO  lisinopril-hydrochlorothiazide (ZESTORETIC) 20-25 MG tablet Take 1 tablet by mouth daily. 07/02/19   Olevia Perches P, DO  Multiple Vitamin (MULTI-VITAMINS) TABS Take 1 tablet by mouth daily.     [provider]  naproxen (NAPROSYN) 500 MG tablet Take 1 tablet (500 mg total) by mouth 2 (two) times daily with a meal. 07/02/19   Dorcas Carrow, DO    Allergies Shellfish allergy    Social History Social History  Tobacco Use  . Smoking status: Current Every Day Smoker    Packs/day: 1.00    Years: 35.00    Pack years: 35.00    Types: Cigarettes  . Smokeless tobacco: Never Used  Vaping Use  . Vaping Use: Never used  Substance Use Topics  . Alcohol use: Yes    Alcohol/week: 3.0 standard drinks    Types: 3 Cans of beer per week    Comment: weekends  . Drug use: No    Review of Systems Patient denies headaches, rhinorrhea, blurry vision, numbness, shortness of breath, chest pain, edema, cough, abdominal pain, nausea, vomiting, diarrhea, dysuria, fevers, rashes or hallucinations unless otherwise stated above in HPI. ____________________________________________   PHYSICAL EXAM:  VITAL SIGNS: Vitals:   11/14/19  1606 11/14/19 1815  BP: 124/70 (!) 123/48  Pulse: 67 74  Resp: (!) 26 (!) 28  Temp: 98.4 F (36.9 C) 98.2 F (36.8 C)  SpO2: 93% 96%    Constitutional: Alert and oriented.  Eyes: Conjunctivae are normal.  Head: Atraumatic. Nose: No congestion/rhinnorhea. Mouth/Throat: Mucous membranes are moist.   Neck: No stridor. Painless ROM.  Cardiovascular: Normal rate, regular rhythm. Grossly normal heart sounds.  Good peripheral circulation. Respiratory: Coarse respirations throughout with diffuse wheezing there are rhonchi in the left posterior lung field  gastrointestinal: Soft and nontender. No distention. No abdominal bruits. No CVA tenderness. Genitourinary:  Musculoskeletal: No lower extremity tenderness nor edema.  No joint effusions. Neurologic:  Normal speech and language. No gross focal neurologic deficits are appreciated. No facial droop Skin:  Skin is warm, dry and intact. No rash noted. Psychiatric: Mood and affect are normal. Speech and behavior are normal.  ____________________________________________   LABS (all labs ordered are listed, but only abnormal results are displayed)  Results for orders placed or performed during the hospital encounter of 11/14/19 (from the past 24 hour(s))  Basic metabolic panel     Status: Abnormal   Collection Time: 11/14/19  1:46 PM  Result Value Ref Range   Sodium 134 (L) 135 - 145 mmol/L   Potassium 3.7 3.5 - 5.1 mmol/L   Chloride 98 98 - 111 mmol/L   CO2 26 22 - 32 mmol/L   Glucose, Bld 110 (H) 70 - 99 mg/dL   BUN 16 6 - 20 mg/dL   Creatinine, Ser 1.24 0.61 - 1.24 mg/dL   Calcium 9.2 8.9 - 58.0 mg/dL   GFR calc non Af Amer >60 >60 mL/min   GFR calc Af Amer >60 >60 mL/min   Anion gap 10 5 - 15  CBC     Status: Abnormal   Collection Time: 11/14/19  1:46 PM  Result Value Ref Range   WBC 11.5 (H) 4.0 - 10.5 K/uL   RBC 4.48 4.22 - 5.81 MIL/uL   Hemoglobin 14.9 13.0 - 17.0 g/dL   HCT 99.8 39 - 52 %   MCV 90.0 80.0 - 100.0 fL   MCH  33.3 26.0 - 34.0 pg   MCHC 37.0 (H) 30.0 - 36.0 g/dL   RDW 33.8 25.0 - 53.9 %   Platelets 236 150 - 400 K/uL   nRBC 0.0 0.0 - 0.2 %  Troponin I (High Sensitivity)     Status: None   Collection Time: 11/14/19  1:46 PM  Result Value Ref Range   Troponin I (High Sensitivity) 6 <18 ng/L   ____________________________________________ ____________________________________________  RADIOLOGY   ____________________________________________   PROCEDURES  Procedure(s) performed:  .Critical Care Performed by: Willy Eddy, MD Authorized  by: Willy Eddy, MD   Critical care provider statement:    Critical care time (minutes):  10   Critical care time was exclusive of:  Separately billable procedures and treating other patients   Critical care was necessary to treat or prevent imminent or life-threatening deterioration of the following conditions:  Respiratory failure   Critical care was time spent personally by me on the following activities:  Development of treatment plan with patient or surrogate, discussions with consultants, evaluation of patient's response to treatment, examination of patient, obtaining history from patient or surrogate, ordering and performing treatments and interventions, ordering and review of laboratory studies, ordering and review of radiographic studies, pulse oximetry, re-evaluation of patient's condition and review of old charts      Critical Care performed: yes ____________________________________________   INITIAL IMPRESSION / ASSESSMENT AND PLAN / ED COURSE  Pertinent labs & imaging results that were available during my care of the patient were reviewed by me and considered in my medical decision making (see chart for details).   DDX: Asthma, copd, CHF, pna, ptx, malignancy, Pe, anemia   Vencent Hauschild is a 59 y.o. who presents to the ED with presentation as described above.  Patient found with evidence of worsening pneumonia shortness of  breath exam consistent with bronchitis but also x-ray findings concerning for worsening infiltrate and pneumonia.  Covid is negative.  Given his wheezing and symptoms will treat for bronchitis with duo nebs as well as Solu-Medrol.  I have ordered additional IV antibiotics is his outpatient antibiotics were not helping.  Will require hospitalization as he does have acute hypoxic respiratory failure for further medical management.     The patient was evaluated in Emergency Department today for the symptoms described in the history of present illness. He/she was evaluated in the context of the global COVID-19 pandemic, which necessitated consideration that the patient might be at risk for infection with the SARS-CoV-2 virus that causes COVID-19. Institutional protocols and algorithms that pertain to the evaluation of patients at risk for COVID-19 are in a state of rapid change based on information released by regulatory bodies including the CDC and federal and state organizations. These policies and algorithms were followed during the patient's care in the ED.  As part of my medical decision making, I reviewed the following data within the electronic MEDICAL RECORD NUMBER Nursing notes reviewed and incorporated, Labs reviewed, notes from prior ED visits and [place] Controlled Substance Database   ____________________________________________   FINAL CLINICAL IMPRESSION(S) / ED DIAGNOSES  Final diagnoses:  Community acquired pneumonia of left upper lobe of lung  Acute respiratory failure with hypoxia (HCC)      NEW MEDICATIONS STARTED DURING THIS VISIT:  New Prescriptions   No medications on file     Note:  This document was prepared using Dragon voice recognition software and may include unintentional dictation errors.    Willy Eddy, MD 11/14/19 [phone]

## 2019-11-14 NOTE — ED Triage Notes (Signed)
Pt comes into the ED via EMS from [facility] urgent care, with c/o increased SOB, 90%RA. Hx of COPD, duoneb x2, 125mg  solud medrol, on 2L [place] on arrival, hr 80's, rr20, 114/86 #18g RAC

## 2019-11-14 NOTE — ED Triage Notes (Signed)
Patient seen here for pneumonia on Sunday. He states he tried to call his PCP for a f/u and they can't see him. He states he continues to have SOB.

## 2019-11-14 NOTE — ED Notes (Signed)
Patient is being discharged from the Urgent Care and sent to the Emergency Department via EMS . Per Dr. Chaney Malling, patient is in need of higher level of care due to pneumonia and decreased oxygen levels requiring oxygen. Patient is aware and verbalizes understanding of plan of care.  Vitals:   11/14/19 1202 11/14/19 1215  BP:    Pulse:    Resp:    Temp:    SpO2: 94% 93%

## 2019-11-14 NOTE — Telephone Encounter (Signed)
FULL MAIL BOX UNABLE TO MAKE THIS APT.

## 2019-11-14 NOTE — ED Notes (Signed)
Ambulatory oxygen sat 88%. Placed patient on oxygen 2L [place]. 93%

## 2019-11-14 NOTE — ED Provider Notes (Signed)
[facility]    CSN: [id number] Arrival date & time: 11/14/19  1015      History   Chief Complaint Chief Complaint  Patient presents with  . Shortness of Breath    HPI Kenneth Blair is a 59 y.o. male   HPI   States "I just can't breathe." Was at work today and was more and more shortness of breath with pressure like chest pain. Endorses headaches and lightheadedness with walking. Worse with sitting back in the chair. Denies fever. Smokes 1 and 1/2 ppd for past 30+ years. Has COPD. States he has not been able to sleep because he has not been able to breathe.   Patient reports over the past 2 weeks has had productive cough with clear sputum. He took Delsym with some relief.  Seen at [facility] on Sunday and diagnosed with pneumonia. He completed his azithromycin today. Reports similar shortness of breath when he had pneumonia  about 3-4 years ago when he was admitted to the hospital in [place]. Denies recent sick contacts. Recent COVID test was negative.    Past Medical History:  Diagnosis Date  . Head ache   . History of knee problem   . Hyperlipidemia   . Hypertension   . Problems with hearing   . Reflux     Patient Active Problem List   Diagnosis Date Noted  . Primary osteoarthritis of right knee 04/26/2018  . Tobacco abuse 01/09/2016  . Morbid obesity (HCC) 01/09/2016  . 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 Sleep apnea 05/02/2015  . Hyperlipidemia 01/03/2015  . Hypertension 01/03/2015  . Knee pain 01/03/2015    Past Surgical History:  Procedure Laterality Date  . KNEE SURGERY Left        Home Medications    Prior to Admission medications   Medication Sig Start Date End Date Taking? Authorizing Provider  albuterol (VENTOLIN HFA) 108 (90 Base) MCG/ACT inhaler Inhale 2 puffs into the lungs every 6 (six) hours as needed for wheezing or shortness of breath. 07/02/19  Yes Johnson, Megan P, DO  amLODipine (NORVASC) 10 MG tablet Take 1 tablet (10 mg total)  by mouth daily. 07/02/19  Yes Johnson, Megan P, DO  aspirin 81 MG tablet Take 81 mg by mouth daily.   Yes [provider]  famotidine (PEPCID) 20 MG tablet Take 20 mg by mouth daily.    Yes [provider]  fluticasone (FLONASE) 50 MCG/ACT nasal spray Place 2 sprays into both nostrils daily. 07/02/19  Yes Johnson, Megan P, DO  lisinopril-hydrochlorothiazide (ZESTORETIC) 20-25 MG tablet Take 1 tablet by mouth daily. 07/02/19  Yes Johnson, Megan P, DO  Multiple Vitamin (MULTI-VITAMINS) TABS Take 1 tablet by mouth daily.    Yes [provider]  naproxen (NAPROSYN) 500 MG tablet Take 1 tablet (500 mg total) by mouth 2 (two) times daily with a meal. 07/02/19  Yes Johnson, Megan P, DO  azithromycin (ZITHROMAX) 250 MG tablet Take 1 tablet (250 mg total) by mouth daily. Take first 2 tablets together, then 1 every day until finished. 11/11/19   Mickie Bail, NP    Family History Family History  Problem Relation Age of Onset  . Heart disease Mother   . Hypertension Mother   . Stroke Mother   . Asthma Father   . Heart disease Father   . Hypertension Father   . Cancer Brother        Bone  . Hypertension Brother   . Hypertension Son  Social History Social History   Tobacco Use  . Smoking status: Current Every Day Smoker    Packs/day: 1.00    Years: 35.00    Pack years: 35.00    Types: Cigarettes  . Smokeless tobacco: Never Used  Vaping Use  . Vaping Use: Never used  Substance Use Topics  . Alcohol use: Yes    Alcohol/week: 3.0 standard drinks    Types: 3 Cans of beer per week    Comment: weekends  . Drug use: No     Allergies   Shellfish allergy   Review of Systems Review of Systems  Constitutional: Negative for fever.  HENT: Negative for congestion and sore throat.   Respiratory: Positive for cough and shortness of breath.   Cardiovascular: Positive for chest pain.  Gastrointestinal: Negative for nausea and vomiting.  Neurological: Positive for  headaches.  Psychiatric/Behavioral: Positive for sleep disturbance.  All other systems reviewed and are negative.    Physical Exam Triage Vital Signs ED Triage Vitals  Enc Vitals Group     BP 11/14/19 1100 138/74     Pulse Rate 11/14/19 1100 82     Resp 11/14/19 1100 (!) 24     Temp 11/14/19 1100 98.6 F (37 C)     Temp Source 11/14/19 1100 Oral     SpO2 11/14/19 1100 91 %     Weight --      Height --      Head Circumference --      Peak Flow --      Pain Score 11/14/19 1057 8     Pain Loc --      Pain Edu? --      Excl. in GC? --    No data found.  Updated Vital Signs BP 138/74 (BP Location: Right Arm)   Pulse 82   Temp 98.6 F (37 C) (Oral)   Resp (!) 24   SpO2 93%   Visual Acuity Right Eye Distance:   Left Eye Distance:   Bilateral Distance:    Right Eye Near:   Left Eye Near:    Bilateral Near:     Physical Exam Vitals and nursing note reviewed.  Constitutional:      Appearance: He is well-developed.     Comments: Sitting on the edge of the chair  HENT:     Head: Normocephalic and atraumatic.  Eyes:     Extraocular Movements: Extraocular movements intact.  Cardiovascular:     Rate and Rhythm: Normal rate and regular rhythm.     Pulses: Normal pulses.  Pulmonary:     Effort: Tachypnea present. No accessory muscle usage.     Breath sounds: Examination of the right-upper field reveals rhonchi. Examination of the left-upper field reveals rhonchi. Examination of the right-middle field reveals rhonchi. Examination of the left-middle field reveals rhonchi. Examination of the right-lower field reveals rhonchi. Examination of the left-lower field reveals rhonchi. Wheezing (mild expiratory ) and rhonchi present. No rales.     Comments: Wearing 3L [place] Musculoskeletal:        General: Normal Blair of motion.     Cervical back: Normal Blair of motion.  Skin:    General: Skin is warm and dry.     Capillary Refill: Capillary refill takes less than 2 seconds.      Coloration: Skin is not cyanotic.  Neurological:     Mental Status: He is alert and oriented to person, place, and time.  Psychiatric:  Mood and Affect: Mood normal.        Behavior: Behavior normal.      UC Treatments / Results  Labs (all labs ordered are listed, but only abnormal results are displayed) Labs Reviewed  SARS CORONAVIRUS 2 (TAT 6-24 HRS)    EKG: HR 73. Sinus rhythm, normal axis. No acute ST or T wave changes.    Radiology DG Chest 2 View  Result Date: 11/14/2019 CLINICAL DATA:  Shortness of breath and cough EXAM: CHEST - 2 VIEW COMPARISON:  November 11, 2019 FINDINGS: There is ill-defined airspace opacity in the left upper lobe. Lungs otherwise are clear. Heart size and pulmonary vascularity are normal. No adenopathy. No bone lesions. IMPRESSION: Ill-defined airspace opacity left upper lobe without consolidation. This focus is consistent with pneumonia, potentially of atypical organism etiology. In this regard, advise check of COVID-19 status. Lungs elsewhere clear. Heart size and pulmonary vascularity normal. No adenopathy. Electronically Signed   By: Bretta Bang III M.D.   On: 11/14/2019 11:53    Procedures Procedures (including critical care time)  Medications Ordered in UC Medications - No data to display  Initial Impression / Assessment and Plan / UC Course  I have reviewed the triage vital signs and the nursing notes.  Pertinent labs & imaging results that were available during my care of the patient were reviewed by me and considered in my medical decision making (see chart for details).     Dyspnea  Patient with worsening of dyspnea. Patient completed azithromycin today. Hypoxic with ambulation to 88-89%. Placed on 3 L [place] and satting 94%. Repeat COVID test pending. Patient tachypneic with rhonchi and expiratory wheezing on exam. CXR significant for ill-defined airspace opacity left upper lobe without consolidation that is concerning for  atypical pneumonia. Reviewed previous CXR and compared to today. Patient with new oxygen requirement and new changes to CXR. Discussed need for EMS transport to ED with patient. He agrees with plan.   Discussed with EMS. Visual merchandiser at [facility] ED who await patient's arrival.     Final Clinical Impressions(s) / UC Diagnoses   Final diagnoses:  Cough  Acute respiratory failure with hypoxia (HCC)  Pneumonia due to infectious organism, unspecified laterality, unspecified part of lung  Encounter for laboratory testing for COVID-19 virus     Discharge Instructions     You were seen at the Urgent Care for shortness of breath. We are sending your to the emergency department for further evaluation.   Dr. Rachael Darby      ED Prescriptions    None     PDMP not reviewed this encounter.   Katha Cabal, DO 11/14/19 1229

## 2019-11-14 NOTE — Telephone Encounter (Signed)
Pt. Reports he has been on Z-pack for pneumonia and is not any better. Having shortness of breath. No fever. No availability in the practice per Christan. Pt. States he will go to UC.  Reason for Disposition  Patient sounds very sick or weak to the triager  Answer Assessment - Initial Assessment Questions 1. SYMPTOMS: "What symptoms are you most concerned about?" "Is it better, the same, or worse compared to when you saw the doctor?"     Shortness of breath 2. BREATHING DIFFICULTY: "Are you having any difficulty breathing?" If Yes, ask: "How bad is it?"  (e.g., none, mild, moderate, severe)   - MILD: No SOB at rest, mild SOB with walking, speaks normally in sentences, can lay down, no retractions, pulse < 100.    - MODERATE: SOB at rest, SOB with minimal exertion and prefers to sit, cannot lie down flat, speaks in phrases, mild retractions, audible wheezing, pulse 100-120.    - SEVERE: Very SOB at rest, speaks in single words, struggling to breathe, sitting hunched forward, retractions, pulse > 120      Moderate 3. FEVER: "Do you have a fever?" If Yes, ask: "What is your temperature, how was it measured, and when did it start?"     No 4. SPUTUM: "Describe the color of your sputum" (clear, white, yellow, green, blood-tinged)     Clear 5. DIAGNOSIS CONFIRMATION: "When was the pneumonia diagnosed?" "By whom?"     Yes - UC 6. ANTIBIOTIC: "Are you taking an antibiotic?"  If Yes, ask: "Which one?" "When was it started?"     Z-pack 7. OTHER TREATMENT: "Are you receiving any other treatment for the pneumonia?" (e.g., albuterol nebulizer, oxygen) If Yes, ask: "How often?" and "Do they help?"     No 8. HOSPITAL ADMISSION: "Were you hospitalized for this pneumonia?" If Yes, ask: "When were you discharged home from the hospital?"     No  Protocols used: PNEUMONIA ON ANTIBIOTIC FOLLOW-UP CALL-A-AH

## 2019-11-14 NOTE — ED Triage Notes (Signed)
Patient arrived by EMS from urgent care for cough and sob X2 weeks. Negative covid test Sunday. Another covid test completed today at urgent care with no results yet. Reports he wears cpap at night. Denies CHF or COPD. PAtient on 2L O2 in triage with sats of 94%. Reports urgent care sent him here due to O2 sats dropping to low 80s when ambulating. Rainbow and SST tops sent to lab

## 2019-11-15 ENCOUNTER — Observation Stay: Payer: BC Managed Care – PPO

## 2019-11-15 ENCOUNTER — Encounter: Payer: Self-pay | Admitting: Internal Medicine

## 2019-11-15 DIAGNOSIS — J9601 Acute respiratory failure with hypoxia: Secondary | ICD-10-CM | POA: Diagnosis present

## 2019-11-15 DIAGNOSIS — Z8249 Family history of ischemic heart disease and other diseases of the circulatory system: Secondary | ICD-10-CM | POA: Diagnosis not present

## 2019-11-15 DIAGNOSIS — Z791 Long term (current) use of non-steroidal anti-inflammatories (NSAID): Secondary | ICD-10-CM | POA: Diagnosis not present

## 2019-11-15 DIAGNOSIS — Z20822 Contact with and (suspected) exposure to covid-19: Secondary | ICD-10-CM | POA: Diagnosis present

## 2019-11-15 DIAGNOSIS — Z825 Family history of asthma and other chronic lower respiratory diseases: Secondary | ICD-10-CM | POA: Diagnosis not present

## 2019-11-15 DIAGNOSIS — J441 Chronic obstructive pulmonary disease with (acute) exacerbation: Secondary | ICD-10-CM

## 2019-11-15 DIAGNOSIS — G4730 Sleep apnea, unspecified: Secondary | ICD-10-CM | POA: Diagnosis present

## 2019-11-15 DIAGNOSIS — E785 Hyperlipidemia, unspecified: Secondary | ICD-10-CM | POA: Diagnosis present

## 2019-11-15 DIAGNOSIS — J440 Chronic obstructive pulmonary disease with acute lower respiratory infection: Secondary | ICD-10-CM | POA: Diagnosis present

## 2019-11-15 DIAGNOSIS — F17210 Nicotine dependence, cigarettes, uncomplicated: Secondary | ICD-10-CM | POA: Diagnosis present

## 2019-11-15 DIAGNOSIS — Z720 Tobacco use: Secondary | ICD-10-CM

## 2019-11-15 DIAGNOSIS — J189 Pneumonia, unspecified organism: Secondary | ICD-10-CM | POA: Diagnosis present

## 2019-11-15 DIAGNOSIS — I10 Essential (primary) hypertension: Secondary | ICD-10-CM | POA: Diagnosis present

## 2019-11-15 DIAGNOSIS — Z823 Family history of stroke: Secondary | ICD-10-CM | POA: Diagnosis not present

## 2019-11-15 DIAGNOSIS — K219 Gastro-esophageal reflux disease without esophagitis: Secondary | ICD-10-CM | POA: Diagnosis present

## 2019-11-15 DIAGNOSIS — Z79899 Other long term (current) drug therapy: Secondary | ICD-10-CM | POA: Diagnosis not present

## 2019-11-15 DIAGNOSIS — G4733 Obstructive sleep apnea (adult) (pediatric): Secondary | ICD-10-CM

## 2019-11-15 DIAGNOSIS — Z792 Long term (current) use of antibiotics: Secondary | ICD-10-CM | POA: Diagnosis not present

## 2019-11-15 DIAGNOSIS — Z8261 Family history of arthritis: Secondary | ICD-10-CM | POA: Diagnosis not present

## 2019-11-15 DIAGNOSIS — Z7982 Long term (current) use of aspirin: Secondary | ICD-10-CM | POA: Diagnosis not present

## 2019-11-15 LAB — BASIC METABOLIC PANEL
Anion gap: 12 (ref 5–15)
BUN: 18 mg/dL (ref 6–20)
CO2: 25 mmol/L (ref 22–32)
Calcium: 9.2 mg/dL (ref 8.9–10.3)
Chloride: 98 mmol/L (ref 98–111)
Creatinine, Ser: 0.96 mg/dL (ref 0.61–1.24)
GFR calc Af Amer: >60 mL/min (ref >60)
GFR calc non Af Amer: >60 mL/min (ref >60)
Glucose, Bld: 165 mg/dL — ABNORMAL HIGH (ref 70–99)
Potassium: 4.1 mmol/L (ref 3.5–5.1)
Sodium: 135 mmol/L (ref 135–145)

## 2019-11-15 LAB — CBC
HCT: 39.9 % (ref 39.0–52.0)
Hemoglobin: 14.7 g/dL (ref 13.0–17.0)
MCH: 32.9 pg (ref 26.0–34.0)
MCHC: 36.8 g/dL — ABNORMAL HIGH (ref 30.0–36.0)
MCV: 89.3 fL (ref 80.0–100.0)
Platelets: 235 10*3/uL (ref 150–400)
RBC: 4.47 MIL/uL (ref 4.22–5.81)
RDW: 12.6 % (ref 11.5–15.5)
WBC: 16.8 10*3/uL — ABNORMAL HIGH (ref 4.0–10.5)
nRBC: 0 % (ref 0.0–0.2)

## 2019-11-15 LAB — HIV ANTIBODY (ROUTINE TESTING W REFLEX): HIV Screen 4th Generation wRfx: NONREACTIVE

## 2019-11-15 MED ORDER — LISINOPRIL-HYDROCHLOROTHIAZIDE 20-25 MG PO TABS: 1.0000 | ORAL_TABLET | Freq: Every day | ORAL | Status: DC

## 2019-11-15 MED ORDER — SODIUM CHLORIDE 0.9 % IV SOLN
2.0000 g | INTRAVENOUS | Status: DC
  Administered 2019-11-15 – 2019-11-18 (×4): 2 g via INTRAVENOUS
  Filled 2019-11-15: qty 2
  Filled 2019-11-15: qty 20
  Filled 2019-11-15 (×2): qty 2

## 2019-11-15 MED ORDER — AMLODIPINE BESYLATE 10 MG PO TABS
10.0000 mg | ORAL_TABLET | Freq: Every day | ORAL | Status: DC
  Administered 2019-11-15 – 2019-11-18 (×4): 10 mg via ORAL
  Filled 2019-11-15: qty 1
  Filled 2019-11-15: qty 2
  Filled 2019-11-15 (×2): qty 1

## 2019-11-15 MED ORDER — LISINOPRIL 20 MG PO TABS
20.0000 mg | ORAL_TABLET | Freq: Every day | ORAL | Status: DC
  Administered 2019-11-15 – 2019-11-18 (×4): 20 mg via ORAL
  Filled 2019-11-15: qty 2
  Filled 2019-11-15 (×4): qty 1

## 2019-11-15 MED ORDER — HYDROCHLOROTHIAZIDE 25 MG PO TABS
25.0000 mg | ORAL_TABLET | Freq: Every day | ORAL | Status: DC
  Administered 2019-11-15 – 2019-11-18 (×4): 25 mg via ORAL
  Filled 2019-11-15 (×4): qty 1

## 2019-11-15 MED ORDER — GUAIFENESIN ER 600 MG PO TB12
1200.0000 mg | ORAL_TABLET | Freq: Two times a day (BID) | ORAL | Status: DC
  Administered 2019-11-15 – 2019-11-18 (×7): 1200 mg via ORAL
  Filled 2019-11-15 (×8): qty 2

## 2019-11-15 MED ORDER — SODIUM CHLORIDE 0.9 % IV SOLN
500.0000 mg | INTRAVENOUS | Status: DC
  Administered 2019-11-15 – 2019-11-16 (×2): 500 mg via INTRAVENOUS
  Filled 2019-11-15 (×3): qty 500

## 2019-11-15 MED ORDER — IOHEXOL 300 MG/ML  SOLN
75.0000 mL | Freq: Once | INTRAMUSCULAR | Status: AC | PRN
  Administered 2019-11-15: 75 mL via INTRAVENOUS

## 2019-11-15 MED ORDER — KETOROLAC TROMETHAMINE 30 MG/ML IJ SOLN
INTRAMUSCULAR | Status: AC
  Administered 2019-11-15: 15 mg
  Filled 2019-11-15: qty 1

## 2019-11-15 MED ORDER — IPRATROPIUM-ALBUTEROL 0.5-2.5 (3) MG/3ML IN SOLN
3.0000 mL | Freq: Four times a day (QID) | RESPIRATORY_TRACT | Status: DC
  Administered 2019-11-15 – 2019-11-16 (×6): 3 mL via RESPIRATORY_TRACT
  Filled 2019-11-15 (×6): qty 3

## 2019-11-15 MED ORDER — METHYLPREDNISOLONE SODIUM SUCC 40 MG IJ SOLR
40.0000 mg | Freq: Two times a day (BID) | INTRAMUSCULAR | Status: DC
  Administered 2019-11-15 – 2019-11-18 (×7): 40 mg via INTRAVENOUS
  Filled 2019-11-15 (×7): qty 1

## 2019-11-15 MED ORDER — BUDESONIDE 0.5 MG/2ML IN SUSP
0.5000 mg | Freq: Two times a day (BID) | RESPIRATORY_TRACT | Status: DC
  Administered 2019-11-15 – 2019-11-18 (×5): 0.5 mg via RESPIRATORY_TRACT
  Filled 2019-11-15 (×6): qty 2

## 2019-11-15 MED ORDER — KETOROLAC TROMETHAMINE 15 MG/ML IJ SOLN
15.0000 mg | Freq: Three times a day (TID) | INTRAMUSCULAR | Status: DC | PRN
  Filled 2019-11-15: qty 1

## 2019-11-15 MED ORDER — ENOXAPARIN SODIUM 40 MG/0.4ML ~~LOC~~ SOLN
40.0000 mg | SUBCUTANEOUS | Status: DC
  Administered 2019-11-15 – 2019-11-17 (×3): 40 mg via SUBCUTANEOUS
  Filled 2019-11-15 (×3): qty 0.4

## 2019-11-15 MED ORDER — ALBUTEROL SULFATE (2.5 MG/3ML) 0.083% IN NEBU: 2.5000 mg | INHALATION_SOLUTION | RESPIRATORY_TRACT | Status: DC | PRN

## 2019-11-15 NOTE — ED Notes (Signed)
Pt sating at [address] and st "feeling a little agitated can I get more oxygen". Pt speaking in clear sentences at this time. Pt placed on 3 L sating at 95%

## 2019-11-15 NOTE — ED Notes (Signed)
Breakfast provided for pt along with water.

## 2019-11-15 NOTE — Progress Notes (Signed)
Patient ID: Kenneth Blair, male   DOB: 1960-04-17, 60 y.o.   MRN: [id number] [facility] PROGRESS NOTE  Kenneth Blair WNI:[id number] DOB: 1960/05/27 DOA: 11/14/2019 PCP: Kenneth Perches P, DO  HPI/Subjective: Patient has not been feeling well for good period of time. Having coughing and shortness of breath. Had 2 Covid tests that were negative on the 12th and the 15th. Was on 2 days of Zithromax prior to coming into the hospital. This morning had to be increased to 3.5 L of oxygen in order to oxygenate. Having quite a bit of wheezing. Admitted with pneumonia.  Objective: Vitals:   11/15/19 1200 11/15/19 1300  BP: 130/76 121/82  Pulse: 77 69  Resp:    Temp:    SpO2: 94% 95%   No intake or output data in the 24 hours ending 11/15/19 1503 Filed Weights   11/14/19 1345  Weight: 117.5 kg    ROS: Review of Systems  Respiratory: Positive for cough, shortness of breath and wheezing.   Cardiovascular: Positive for chest pain.  Gastrointestinal: Negative for abdominal pain, nausea and vomiting.   Exam: Physical Exam HENT:     Head: Normocephalic.     Nose: No mucosal edema.     Mouth/Throat:     Pharynx: No oropharyngeal exudate.  Eyes:     General: Lids are normal.     Conjunctiva/sclera: Conjunctivae normal.     Pupils: Pupils are equal, round, and reactive to light.  Cardiovascular:     Rate and Rhythm: Normal rate and regular rhythm.     Heart sounds: Normal heart sounds, S1 normal and S2 normal.  Pulmonary:     Breath sounds: Examination of the right-middle field reveals decreased breath sounds and wheezing. Examination of the left-middle field reveals decreased breath sounds and wheezing. Examination of the right-lower field reveals decreased breath sounds and wheezing. Examination of the left-lower field reveals decreased breath sounds and wheezing. Decreased breath sounds and wheezing present. No rhonchi or rales.  Abdominal:     Palpations: Abdomen is soft.      Tenderness: There is no abdominal tenderness.  Musculoskeletal:     Right lower leg: Swelling present.     Left lower leg: Swelling present.  Skin:    General: Skin is warm.     Findings: No rash.  Neurological:     Mental Status: He is alert and oriented to person, place, and time.       Data Reviewed: Basic Metabolic Panel: Recent Labs  Lab 11/14/19 1346 11/15/19 0747  NA 134* 135  K 3.7 4.1  CL 98 98  CO2 26 25  GLUCOSE 110* 165*  BUN 16 18  CREATININE 0.91 0.96  CALCIUM 9.2 9.2   CBC: Recent Labs  Lab 11/14/19 1346 11/15/19 0747  WBC 11.5* 16.8*  HGB 14.9 14.7  HCT 40.3 39.9  MCV 90.0 89.3  PLT 236 235    Recent Results (from the past 240 hour(s))  SARS CORONAVIRUS 2 (TAT 6-24 HRS) Nasopharyngeal Nasopharyngeal Swab     Status: None   Collection Time: 11/11/19  3:29 PM   Specimen: Nasopharyngeal Swab  Result Value Ref Range Status   SARS Coronavirus 2 NEGATIVE NEGATIVE Final    Comment: (NOTE) SARS-CoV-2 target nucleic acids are NOT DETECTED.  The SARS-CoV-2 RNA is generally detectable in upper and lower respiratory specimens during the acute phase of infection. Negative results do not preclude SARS-CoV-2 infection, do not rule out co-infections with other pathogens, and should not  be used as the sole basis for treatment or other patient management decisions. Negative results must be combined with clinical observations, patient history, and epidemiological information. The expected result is Negative.  Fact Sheet for Patients: HairSlick.no  Fact Sheet for Healthcare Providers: quierodirigir.com  This test is not yet approved or cleared by the [place] FDA and  has been authorized for detection and/or diagnosis of SARS-CoV-2 by FDA under an Emergency Use Authorization (EUA). This EUA will remain  in effect (meaning this test can be used) for the duration of the COVID-19 declaration under  Se ction 564(b)(1) of the Act, 21 U.S.C. section 360bbb-3(b)(1), unless the authorization is terminated or revoked sooner.  Performed at [facility], [address], [place], [place] [address]   SARS CORONAVIRUS 2 (TAT 6-24 HRS) Nasopharyngeal Nasopharyngeal Swab     Status: None   Collection Time: 11/14/19 11:42 AM   Specimen: Nasopharyngeal Swab  Result Value Ref Range Status   SARS Coronavirus 2 NEGATIVE NEGATIVE Final    Comment: (NOTE) SARS-CoV-2 target nucleic acids are NOT DETECTED.  The SARS-CoV-2 RNA is generally detectable in upper and lower respiratory specimens during the acute phase of infection. Negative results do not preclude SARS-CoV-2 infection, do not rule out co-infections with other pathogens, and should not be used as the sole basis for treatment or other patient management decisions. Negative results must be combined with clinical observations, patient history, and epidemiological information. The expected result is Negative.  Fact Sheet for Patients: HairSlick.no  Fact Sheet for Healthcare Providers: quierodirigir.com  This test is not yet approved or cleared by the [place] FDA and  has been authorized for detection and/or diagnosis of SARS-CoV-2 by FDA under an Emergency Use Authorization (EUA). This EUA will remain  in effect (meaning this test can be used) for the duration of the COVID-19 declaration under Se ction 564(b)(1) of the Act, 21 U.S.C. section 360bbb-3(b)(1), unless the authorization is terminated or revoked sooner.  Performed at [facility], [address], [place], [place] [address]   Blood Culture (routine x 2)     Status: None (Preliminary result)   Collection Time: 11/14/19 11:18 PM   Specimen: BLOOD  Result Value Ref Range Status   Specimen Description BLOOD LEFT AC  Final   Special Requests   Final    BOTTLES DRAWN AEROBIC AND ANAEROBIC Blood Culture  adequate volume   Culture   Final    NO GROWTH < 12 HOURS Performed at [facility], [address] [place], [place] [address]    Report Status PENDING  Incomplete  Blood Culture (routine x 2)     Status: None (Preliminary result)   Collection Time: 11/14/19 11:18 PM   Specimen: BLOOD  Result Value Ref Range Status   Specimen Description BLOOD RIGHT FA  Final   Special Requests   Final    BOTTLES DRAWN AEROBIC AND ANAEROBIC Blood Culture adequate volume   Culture   Final    NO GROWTH < 12 HOURS Performed at [facility], [address], [place], [place] [address]    Report Status PENDING  Incomplete     Studies: DG Chest 2 View  Result Date: 11/14/2019 CLINICAL DATA:  Shortness of breath and cough EXAM: CHEST - 2 VIEW COMPARISON:  November 11, 2019 FINDINGS: There is ill-defined airspace opacity in the left upper lobe. Lungs otherwise are clear. Heart size and pulmonary vascularity are normal. No adenopathy. No bone lesions. IMPRESSION:  Ill-defined airspace opacity left upper lobe without consolidation. This focus is consistent with pneumonia, potentially of atypical organism etiology. In this regard, advise check of COVID-19 status. Lungs elsewhere clear. Heart size and pulmonary vascularity normal. No adenopathy. Electronically Signed   By: Bretta BangWilliam  Woodruff III M.D.   On: 11/14/2019 11:53   CT Chest W Contrast  Result Date: 11/15/2019 CLINICAL DATA:  Cough and short of breath for 2 weeks EXAM: CT CHEST WITH CONTRAST TECHNIQUE: Multidetector CT imaging of the chest was performed during intravenous contrast administration. CONTRAST:  75mL OMNIPAQUE IOHEXOL 300 MG/ML  SOLN COMPARISON:  11/14/2019 FINDINGS: Cardiovascular: The heart and great vessels are unremarkable without pericardial effusion. Moderate atherosclerosis of the coronary vasculature most pronounced in the LAD distribution. Normal caliber of the thoracic aorta, with minimal atherosclerosis. No evidence  of dissection. Mediastinum/Nodes: No enlarged mediastinal, hilar, or axillary lymph nodes. Thyroid gland, trachea, and esophagus demonstrate no significant findings. Lungs/Pleura: Scattered bilateral ground-glass airspace disease most pronounced throughout the left upper lobe. No effusion or pneumothorax. The central airways are patent. Upper Abdomen: Benign left adrenal adenoma. No acute upper abdominal findings. Musculoskeletal: No acute or destructive bony lesions. Reconstructed images demonstrate no additional findings. IMPRESSION: 1. Patchy bilateral ground-glass airspace disease most pronounced in the left upper lobe. Findings are consistent with multifocal pneumonia, in a pattern typical for COVID-19. 2. Aortic Atherosclerosis (ICD10-I70.0). Coronary artery atherosclerosis Electronically Signed   By: Sharlet SalinaMichael  Brown M.D.   On: 11/15/2019 00:34    Scheduled Meds: . amLODipine  10 mg Oral Daily  . budesonide (PULMICORT) nebulizer solution  0.5 mg Nebulization BID  . enoxaparin (LOVENOX) injection  40 mg Subcutaneous Q24H  . guaiFENesin  1,200 mg Oral BID  . lisinopril  20 mg Oral Daily   And  . hydrochlorothiazide  25 mg Oral Daily  . ipratropium-albuterol  3 mL Nebulization Q6H  . methylPREDNISolone (SOLU-MEDROL) injection  40 mg Intravenous Q12H   Continuous Infusions: . azithromycin    . cefTRIAXone (ROCEPHIN)  IV Stopped (11/15/19 1123)    Assessment/Plan:  1. Acute hypoxic respiratory failure. This morning patient had to be increased to 3.5 L of oxygen. Pulse ox 88% on presentation on room air. 2. Bilateral pneumonia on IV Rocephin and Zithromax. Send off a sputum culture. Order a procalcitonin for tomorrow. CT scan of the chest showed bilateral groundglass airspace disease. Patient had 2 - Covid test. 3. COPD exacerbation continue Solu-Medrol. Continue nebulizer treatments 4. Essential hypertension on lisinopril HCT and Norvasc. 5. Tobacco abuse. States he has not smoked in over a  week and deferred nicotine patch. 6. Obstructive sleep apnea. CPAP at night     Code Status:     Code Status Orders  (From admission, onward)         Start     Ordered   11/15/19 0002  Full code  Continuous        11/15/19 0003        Code Status History    This patient has a current code status but no historical code status.   Advance Care Planning Activity     Family Communication: Updated Lupita LeashDonna on condition Disposition Plan: Status is: Inpatient  Dispo: The patient is from: Home              Anticipated d/c is to: Home              Anticipated d/c date is: Likely will need a few days of IV antibiotics and IV  steroids              Patient currently being treated for bilateral pneumonia and COPD exacerbation  Antibiotics: Rocephin and Zithromax  Time spent: 28 minutes  [employer]

## 2019-11-15 NOTE — ED Notes (Signed)
Pt sitting on the edge of the bed w/NAD noted at this time.

## 2019-11-15 NOTE — Telephone Encounter (Signed)
Called to make apt.Pt states he is in the hospital and would call to make f.u apt. After he is out, Pt stated he went to urgent care and was sent to [facility]. Pt notified to make f.u the patient verbalized understanding.

## 2019-11-16 LAB — CBC
Hemoglobin: 13.3 g/dL (ref 13.0–17.0)
Platelets: 267 10*3/uL (ref 150–400)
WBC: 14.7 10*3/uL — ABNORMAL HIGH (ref 4.0–10.5)
nRBC: 0 % (ref 0.0–0.2)

## 2019-11-16 LAB — EXPECTORATED SPUTUM ASSESSMENT W GRAM STAIN, RFLX TO RESP C: Special Requests: NORMAL

## 2019-11-16 LAB — PROCALCITONIN: Procalcitonin: 0.17 ng/mL

## 2019-11-16 MED ORDER — IPRATROPIUM-ALBUTEROL 0.5-2.5 (3) MG/3ML IN SOLN
3.0000 mL | Freq: Three times a day (TID) | RESPIRATORY_TRACT | Status: DC
  Administered 2019-11-17 – 2019-11-18 (×3): 3 mL via RESPIRATORY_TRACT
  Filled 2019-11-16 (×5): qty 3

## 2019-11-16 MED ORDER — GUAIFENESIN-DM 100-10 MG/5ML PO SYRP
5.0000 mL | ORAL_SOLUTION | ORAL | Status: DC | PRN
  Administered 2019-11-16: 5 mL via ORAL
  Filled 2019-11-16: qty 5

## 2019-11-16 MED ORDER — SODIUM CHLORIDE 0.9 % IV SOLN
INTRAVENOUS | Status: DC | PRN
  Administered 2019-11-16: 250 mL via INTRAVENOUS

## 2019-11-16 NOTE — Progress Notes (Signed)
Patient ID: Kenneth Blair, male   DOB: 1960-04-09, 59 y.o.   MRN: [id number] [facility] PROGRESS NOTE  Kenneth Blair TSV:[id number] DOB: Dec 17, 1960 DOA: 11/14/2019 PCP: Olevia Perches P, DO  HPI/Subjective: Patient feeling a little bit better today.  Still with some shortness of breath cough and wheeze.  Came in with acute hypoxic respiratory failure bilateral pneumonia.  Objective: Vitals:   11/16/19 0826 11/16/19 1329  BP: (!) 149/78   Pulse: 71   Resp: 16   Temp: (!) 97.5 F (36.4 C)   SpO2: 97% 97%    Intake/Output Summary (Last 24 hours) at 11/16/2019 1545 Last data filed at 11/16/2019 0339 Gross per 24 hour  Intake 250 ml  Output 1000 ml  Net -750 ml   Filed Weights   11/14/19 1345  Weight: 117.5 kg    ROS: Review of Systems  Respiratory: Positive for cough, shortness of breath and wheezing.   Cardiovascular: Positive for chest pain.  Gastrointestinal: Negative for abdominal pain, nausea and vomiting.   Exam: Physical Exam HENT:     Nose: No mucosal edema.     Mouth/Throat:     Pharynx: No oropharyngeal exudate.  Eyes:     General: Lids are normal.     Conjunctiva/sclera: Conjunctivae normal.     Pupils: Pupils are equal, round, and reactive to light.  Cardiovascular:     Rate and Rhythm: Normal rate and regular rhythm.     Heart sounds: Normal heart sounds, S1 normal and S2 normal.  Pulmonary:     Breath sounds: Examination of the right-middle field reveals wheezing. Examination of the left-middle field reveals wheezing. Examination of the right-lower field reveals decreased breath sounds and rhonchi. Examination of the left-lower field reveals decreased breath sounds and rhonchi. Decreased breath sounds, wheezing and rhonchi present. No rales.  Abdominal:     Palpations: Abdomen is soft.     Tenderness: There is no abdominal tenderness.  Musculoskeletal:     Right lower leg: No swelling.     Left lower leg: No swelling.  Skin:    General:  Skin is warm.     Findings: No rash.  Neurological:     Mental Status: He is alert and oriented to person, place, and time.       Data Reviewed: Basic Metabolic Panel: Recent Labs  Lab 11/14/19 1346 11/15/19 0747  NA 134* 135  K 3.7 4.1  CL 98 98  CO2 26 25  GLUCOSE 110* 165*  BUN 16 18  CREATININE 0.91 0.96  CALCIUM 9.2 9.2   CBC: Recent Labs  Lab 11/14/19 1346 11/15/19 0747 11/16/19 0410  WBC 11.5* 16.8* 14.7*  HGB 14.9 14.7 13.3  HCT 40.3 39.9 RESULTS UNAVAILABLE DUE TO INTERFERING SUBSTANCE  MCV 90.0 89.3 RESULTS UNAVAILABLE DUE TO INTERFERING SUBSTANCE  PLT 236 235 267     Recent Results (from the past 240 hour(s))  SARS CORONAVIRUS 2 (TAT 6-24 HRS) Nasopharyngeal Nasopharyngeal Swab     Status: None   Collection Time: 11/11/19  3:29 PM   Specimen: Nasopharyngeal Swab  Result Value Ref Range Status   SARS Coronavirus 2 NEGATIVE NEGATIVE Final    Comment: (NOTE) SARS-CoV-2 target nucleic acids are NOT DETECTED.  The SARS-CoV-2 RNA is generally detectable in upper and lower respiratory specimens during the acute phase of infection. Negative results do not preclude SARS-CoV-2 infection, do not rule out co-infections with other pathogens, and should not be used as the sole basis for treatment or other  patient management decisions. Negative results must be combined with clinical observations, patient history, and epidemiological information. The expected result is Negative.  Fact Sheet for Patients: HairSlick.no  Fact Sheet for Healthcare Providers: quierodirigir.com  This test is not yet approved or cleared by the [place] FDA and  has been authorized for detection and/or diagnosis of SARS-CoV-2 by FDA under an Emergency Use Authorization (EUA). This EUA will remain  in effect (meaning this test can be used) for the duration of the COVID-19 declaration under Se ction 564(b)(1) of the Act, 21  U.S.C. section 360bbb-3(b)(1), unless the authorization is terminated or revoked sooner.  Performed at [facility], [address], [place], [place] [address]   SARS CORONAVIRUS 2 (TAT 6-24 HRS) Nasopharyngeal Nasopharyngeal Swab     Status: None   Collection Time: 11/14/19 11:42 AM   Specimen: Nasopharyngeal Swab  Result Value Ref Range Status   SARS Coronavirus 2 NEGATIVE NEGATIVE Final    Comment: (NOTE) SARS-CoV-2 target nucleic acids are NOT DETECTED.  The SARS-CoV-2 RNA is generally detectable in upper and lower respiratory specimens during the acute phase of infection. Negative results do not preclude SARS-CoV-2 infection, do not rule out co-infections with other pathogens, and should not be used as the sole basis for treatment or other patient management decisions. Negative results must be combined with clinical observations, patient history, and epidemiological information. The expected result is Negative.  Fact Sheet for Patients: HairSlick.no  Fact Sheet for Healthcare Providers: quierodirigir.com  This test is not yet approved or cleared by the [place] FDA and  has been authorized for detection and/or diagnosis of SARS-CoV-2 by FDA under an Emergency Use Authorization (EUA). This EUA will remain  in effect (meaning this test can be used) for the duration of the COVID-19 declaration under Se ction 564(b)(1) of the Act, 21 U.S.C. section 360bbb-3(b)(1), unless the authorization is terminated or revoked sooner.  Performed at [facility], [address], [place], [place] [address]   Blood Culture (routine x 2)     Status: None (Preliminary result)   Collection Time: 11/14/19 11:18 PM   Specimen: BLOOD  Result Value Ref Range Status   Specimen Description BLOOD LEFT AC  Final   Special Requests   Final    BOTTLES DRAWN AEROBIC AND ANAEROBIC Blood Culture adequate volume   Culture   Final     NO GROWTH 2 DAYS Performed at [facility], [address] [place], [place] [address]    Report Status PENDING  Incomplete  Blood Culture (routine x 2)     Status: None (Preliminary result)   Collection Time: 11/14/19 11:18 PM   Specimen: BLOOD  Result Value Ref Range Status   Specimen Description BLOOD RIGHT FA  Final   Special Requests   Final    BOTTLES DRAWN AEROBIC AND ANAEROBIC Blood Culture adequate volume   Culture   Final    NO GROWTH 2 DAYS Performed at [facility], [address], [place], [place] [address]    Report Status PENDING  Incomplete  Expectorated sputum assessment w rflx to resp cult     Status: None   Collection Time: 11/15/19  7:47 AM   Specimen: Expectorated Sputum  Result Value Ref Range Status   Specimen Description EXPECTORATED SPUTUM  Final   Special Requests Normal  Final   Sputum evaluation   Final    THIS SPECIMEN IS ACCEPTABLE FOR SPUTUM CULTURE Performed at [facility],  [address], [place], [place] [address]    Report Status 11/16/2019 FINAL  Final  Culture, respiratory     Status: None (Preliminary result)   Collection Time: 11/15/19  7:47 AM  Result Value Ref Range Status   Specimen Description   Final    EXPECTORATED SPUTUM Performed at [facility], [address], [place], [place] [address]    Special Requests   Final    Normal Reflexed from [id number] Performed at [facility], [address] [place], [place] [address]    Gram Stain   Final    NO WBC SEEN RARE GRAM POSITIVE COCCI IN PAIRS IN CLUSTERS RARE GRAM NEGATIVE RODS Performed at [facility], [address], [place], [place] [address]    Culture PENDING  Incomplete   Report Status PENDING  Incomplete     Studies: CT Chest W Contrast  Result Date: 11/15/2019 CLINICAL DATA:  Cough and short of breath for 2 weeks EXAM: CT CHEST WITH CONTRAST TECHNIQUE: Multidetector CT imaging of the chest was performed during  intravenous contrast administration. CONTRAST:  23mL OMNIPAQUE IOHEXOL 300 MG/ML  SOLN COMPARISON:  11/14/2019 FINDINGS: Cardiovascular: The heart and great vessels are unremarkable without pericardial effusion. Moderate atherosclerosis of the coronary vasculature most pronounced in the LAD distribution. Normal caliber of the thoracic aorta, with minimal atherosclerosis. No evidence of dissection. Mediastinum/Nodes: No enlarged mediastinal, hilar, or axillary lymph nodes. Thyroid gland, trachea, and esophagus demonstrate no significant findings. Lungs/Pleura: Scattered bilateral ground-glass airspace disease most pronounced throughout the left upper lobe. No effusion or pneumothorax. The central airways are patent. Upper Abdomen: Benign left adrenal adenoma. No acute upper abdominal findings. Musculoskeletal: No acute or destructive bony lesions. Reconstructed images demonstrate no additional findings. IMPRESSION: 1. Patchy bilateral ground-glass airspace disease most pronounced in the left upper lobe. Findings are consistent with multifocal pneumonia, in a pattern typical for COVID-19. 2. Aortic Atherosclerosis (ICD10-I70.0). Coronary artery atherosclerosis Electronically Signed   By: Sharlet Salina M.D.   On: 11/15/2019 00:34    Scheduled Meds: . amLODipine  10 mg Oral Daily  . budesonide (PULMICORT) nebulizer solution  0.5 mg Nebulization BID  . enoxaparin (LOVENOX) injection  40 mg Subcutaneous Q24H  . guaiFENesin  1,200 mg Oral BID  . lisinopril  20 mg Oral Daily   And  . hydrochlorothiazide  25 mg Oral Daily  . ipratropium-albuterol  3 mL Nebulization Q6H  . methylPREDNISolone (SOLU-MEDROL) injection  40 mg Intravenous Q12H   Continuous Infusions: . sodium chloride 250 mL (11/16/19 0843)  . azithromycin Stopped (11/16/19 0700)  . cefTRIAXone (ROCEPHIN)  IV 2 g (11/16/19 0847)    Assessment/Plan:  1. Acute hypoxic respiratory failure.  Currently on 3 L of oxygen.  Patient did have a pulse  ox of 88% on presentation on room air.  Check room air pulse ox with ambulation today and tomorrow morning. 2. Bilateral pneumonia on IV Rocephin and Zithromax.  Sputum culture sent off.  Procalcitonin slightly high.  Patient had 2 - Covid tests already. 3. COPD exacerbation.  Standing dose Solu-Medrol and nebulizer treatments. 4. Essential hypertension.  Continue lisinopril HCT and Norvasc 5. Tobacco abuse.  Deferred nicotine patch 6. Obstructive sleep apnea.  Continue CPAP at night     Code Status:     Code Status Orders  (From admission, onward)         Start     Ordered   11/15/19 0002  Full code  Continuous  11/15/19 0003        Code Status History    This patient has a current code status but no historical code status.   Advance Care Planning Activity     Family Communication: Spoke with Lupita LeashDonna on the phone Disposition Plan: Status is: Inpatient  Dispo: The patient is from: Home              Anticipated d/c is to: Home              Anticipated d/c date is: Likely will need a couple days here in the hospital              Patient currently being treated for COPD exacerbation and pneumonia.  Time spent: 26 minutes   Victora Irby [employer]  [facility]

## 2019-11-17 ENCOUNTER — Inpatient Hospital Stay: Payer: BC Managed Care – PPO

## 2019-11-17 DIAGNOSIS — K219 Gastro-esophageal reflux disease without esophagitis: Secondary | ICD-10-CM

## 2019-11-17 LAB — BASIC METABOLIC PANEL
Anion gap: 7 (ref 5–15)
BUN: 23 mg/dL — ABNORMAL HIGH (ref 6–20)
CO2: 30 mmol/L (ref 22–32)
Calcium: 9 mg/dL (ref 8.9–10.3)
Chloride: 101 mmol/L (ref 98–111)
Creatinine, Ser: 0.79 mg/dL (ref 0.61–1.24)
GFR calc Af Amer: >60 mL/min (ref >60)
GFR calc non Af Amer: >60 mL/min (ref >60)
Glucose, Bld: 146 mg/dL — ABNORMAL HIGH (ref 70–99)
Potassium: 4.2 mmol/L (ref 3.5–5.1)
Sodium: 138 mmol/L (ref 135–145)

## 2019-11-17 LAB — BRAIN NATRIURETIC PEPTIDE: B Natriuretic Peptide: 53.8 pg/mL (ref 0.0–100.0)

## 2019-11-17 LAB — PROCALCITONIN: Procalcitonin: <0.1 ng/mL

## 2019-11-17 MED ORDER — FLUTICASONE PROPIONATE 50 MCG/ACT NA SUSP
2.0000 | Freq: Every day | NASAL | Status: DC
  Filled 2019-11-17: qty 16

## 2019-11-17 MED ORDER — HYDROCOD POLST-CPM POLST ER 10-8 MG/5ML PO SUER
5.0000 mL | Freq: Two times a day (BID) | ORAL | Status: DC
  Administered 2019-11-17 – 2019-11-18 (×3): 5 mL via ORAL
  Filled 2019-11-17 (×3): qty 5

## 2019-11-17 MED ORDER — DOXYCYCLINE HYCLATE 100 MG PO TABS
100.0000 mg | ORAL_TABLET | Freq: Two times a day (BID) | ORAL | Status: DC
  Administered 2019-11-17 – 2019-11-18 (×3): 100 mg via ORAL
  Filled 2019-11-17 (×3): qty 1

## 2019-11-17 MED ORDER — ADULT MULTIVITAMIN W/MINERALS CH
1.0000 | ORAL_TABLET | Freq: Every day | ORAL | Status: DC
  Administered 2019-11-17 – 2019-11-18 (×2): 1 via ORAL
  Filled 2019-11-17 (×2): qty 1

## 2019-11-17 MED ORDER — PANTOPRAZOLE SODIUM 40 MG PO TBEC
40.0000 mg | DELAYED_RELEASE_TABLET | Freq: Two times a day (BID) | ORAL | Status: DC
  Administered 2019-11-17 – 2019-11-18 (×3): 40 mg via ORAL
  Filled 2019-11-17 (×3): qty 1

## 2019-11-17 MED ORDER — ASPIRIN EC 81 MG PO TBEC
81.0000 mg | DELAYED_RELEASE_TABLET | Freq: Every day | ORAL | Status: DC
  Administered 2019-11-17 – 2019-11-18 (×2): 81 mg via ORAL
  Filled 2019-11-17 (×2): qty 1

## 2019-11-17 NOTE — Progress Notes (Signed)
Received report from Janie, RN. Assuming care of patient at this time 

## 2019-11-17 NOTE — Progress Notes (Signed)
Patient ID: Kenneth GoDavid Keith Halfhill, male   DOB: 03/14/1960, 59 y.o.   MRN: [id number] [facility] PROGRESS NOTE  Kenneth Blair [id number]:[id number] DOB: 08/18/1960 DOA: 11/14/2019 PCP: Dorcas CarrowJohnson, Megan P, DO  HPI/Subjective: Patient stated he had a lot of coughing yesterday afternoon and again had a coughing fit this morning.  Was able to come off the oxygen yesterday afternoon with ambulation.  Still not feeling great because of the coughing.  Has a little pain with the coughing.  Objective: Vitals:   11/17/19 0806 11/17/19 1600  BP: (!) 144/79 126/67  Pulse: 81 73  Resp: 17 18  Temp: 98.2 F (36.8 C) 98.3 F (36.8 C)  SpO2: 90% 92%    Intake/Output Summary (Last 24 hours) at 11/17/2019 1614 Last data filed at 11/17/2019 1409 Gross per 24 hour  Intake 970 ml  Output 1450 ml  Net -480 ml   Filed Weights   11/14/19 1345  Weight: 117.5 kg    ROS: Review of Systems  Respiratory: Positive for cough and shortness of breath.   Cardiovascular: Positive for chest pain.  Gastrointestinal: Negative for abdominal pain, nausea and vomiting.   Exam: Physical Exam HENT:     Head: Normocephalic.     Nose: No mucosal edema.     Mouth/Throat:     Pharynx: No oropharyngeal exudate.  Eyes:     General: Lids are normal.     Conjunctiva/sclera: Conjunctivae normal.  Cardiovascular:     Rate and Rhythm: Normal rate and regular rhythm.     Heart sounds: Normal heart sounds, S1 normal and S2 normal.  Pulmonary:     Breath sounds: Examination of the right-middle field reveals wheezing. Examination of the left-middle field reveals wheezing. Examination of the right-lower field reveals decreased breath sounds and rhonchi. Examination of the left-lower field reveals decreased breath sounds and rhonchi. Decreased breath sounds, wheezing and rhonchi present.  Abdominal:     Palpations: Abdomen is soft.     Tenderness: There is no abdominal tenderness.  Musculoskeletal:     Right lower leg: No  swelling.     Left lower leg: No swelling.  Skin:    General: Skin is warm.     Findings: No rash.  Neurological:     Mental Status: He is alert and oriented to person, place, and time.       Data Reviewed: Basic Metabolic Panel: Recent Labs  Lab 11/14/19 1346 11/15/19 0747 11/17/19 0447  NA 134* 135 138  K 3.7 4.1 4.2  CL 98 98 101  CO2 26 25 30   GLUCOSE 110* 165* 146*  BUN 16 18 23*  CREATININE 0.91 0.96 0.79  CALCIUM 9.2 9.2 9.0   CBC: Recent Labs  Lab 11/14/19 1346 11/15/19 0747 11/16/19 0410  WBC 11.5* 16.8* 14.7*  HGB 14.9 14.7 13.3  HCT 40.3 39.9 RESULTS UNAVAILABLE DUE TO INTERFERING SUBSTANCE  MCV 90.0 89.3 RESULTS UNAVAILABLE DUE TO INTERFERING SUBSTANCE  PLT 236 235 267   BNP (last 3 results) Recent Labs    11/17/19 0447  BNP 53.8     Recent Results (from the past 240 hour(s))  SARS CORONAVIRUS 2 (TAT 6-24 HRS) Nasopharyngeal Nasopharyngeal Swab     Status: None   Collection Time: 11/11/19  3:29 PM   Specimen: Nasopharyngeal Swab  Result Value Ref Range Status   SARS Coronavirus 2 NEGATIVE NEGATIVE Final    Comment: (NOTE) SARS-CoV-2 target nucleic acids are NOT DETECTED.  The SARS-CoV-2 RNA is generally detectable in upper  and lower respiratory specimens during the acute phase of infection. Negative results do not preclude SARS-CoV-2 infection, do not rule out co-infections with other pathogens, and should not be used as the sole basis for treatment or other patient management decisions. Negative results must be combined with clinical observations, patient history, and epidemiological information. The expected result is Negative.  Fact Sheet for Patients: HairSlick.no  Fact Sheet for Healthcare Providers: quierodirigir.com  This test is not yet approved or cleared by the [place] FDA and  has been authorized for detection and/or diagnosis of SARS-CoV-2 by FDA under an  Emergency Use Authorization (EUA). This EUA will remain  in effect (meaning this test can be used) for the duration of the COVID-19 declaration under Se ction 564(b)(1) of the Act, 21 U.S.C. section 360bbb-3(b)(1), unless the authorization is terminated or revoked sooner.  Performed at [facility], [address], [place], [place] [address]   SARS CORONAVIRUS 2 (TAT 6-24 HRS) Nasopharyngeal Nasopharyngeal Swab     Status: None   Collection Time: 11/14/19 11:42 AM   Specimen: Nasopharyngeal Swab  Result Value Ref Range Status   SARS Coronavirus 2 NEGATIVE NEGATIVE Final    Comment: (NOTE) SARS-CoV-2 target nucleic acids are NOT DETECTED.  The SARS-CoV-2 RNA is generally detectable in upper and lower respiratory specimens during the acute phase of infection. Negative results do not preclude SARS-CoV-2 infection, do not rule out co-infections with other pathogens, and should not be used as the sole basis for treatment or other patient management decisions. Negative results must be combined with clinical observations, patient history, and epidemiological information. The expected result is Negative.  Fact Sheet for Patients: HairSlick.no  Fact Sheet for Healthcare Providers: quierodirigir.com  This test is not yet approved or cleared by the [place] FDA and  has been authorized for detection and/or diagnosis of SARS-CoV-2 by FDA under an Emergency Use Authorization (EUA). This EUA will remain  in effect (meaning this test can be used) for the duration of the COVID-19 declaration under Se ction 564(b)(1) of the Act, 21 U.S.C. section 360bbb-3(b)(1), unless the authorization is terminated or revoked sooner.  Performed at [facility], [address], [place], [place] [address]   Blood Culture (routine x 2)     Status: None (Preliminary result)   Collection Time: 11/14/19 11:18 PM   Specimen: BLOOD  Result  Value Ref Range Status   Specimen Description BLOOD LEFT AC  Final   Special Requests   Final    BOTTLES DRAWN AEROBIC AND ANAEROBIC Blood Culture adequate volume   Culture   Final    NO GROWTH 3 DAYS Performed at [facility], [address] [place], [place] [address]    Report Status PENDING  Incomplete  Blood Culture (routine x 2)     Status: None (Preliminary result)   Collection Time: 11/14/19 11:18 PM   Specimen: BLOOD  Result Value Ref Range Status   Specimen Description BLOOD RIGHT FA  Final   Special Requests   Final    BOTTLES DRAWN AEROBIC AND ANAEROBIC Blood Culture adequate volume   Culture   Final    NO GROWTH 3 DAYS Performed at [facility], [address] [place], [place] [address]    Report Status PENDING  Incomplete  Expectorated sputum assessment w rflx to resp cult     Status: None   Collection Time: 11/15/19  7:47 AM   Specimen: Expectorated Sputum  Result Value Ref Range  Status   Specimen Description EXPECTORATED SPUTUM  Final   Special Requests Normal  Final   Sputum evaluation   Final    THIS SPECIMEN IS ACCEPTABLE FOR SPUTUM CULTURE Performed at [facility], [address] [place], [place] [address]    Report Status 11/16/2019 FINAL  Final  Culture, respiratory     Status: None (Preliminary result)   Collection Time: 11/15/19  7:47 AM  Result Value Ref Range Status   Specimen Description   Final    EXPECTORATED SPUTUM Performed at [facility], [address], [place], [place] [address]    Special Requests   Final    Normal Reflexed from [id number] Performed at [facility], [address] [place], [place] [address]    Gram Stain   Final    NO WBC SEEN RARE GRAM POSITIVE COCCI IN PAIRS IN CLUSTERS RARE GRAM NEGATIVE RODS    Culture   Final    CULTURE REINCUBATED FOR BETTER GROWTH Performed at [facility], [address] [place], [place] [address]    Report Status PENDING   Incomplete     Studies: DG Chest 2 View  Result Date: 11/17/2019 CLINICAL DATA:  59 year old male with history of smoking and pneumonia EXAM: CHEST - 2 VIEW COMPARISON:  11/14/2019, CT 11/15/2019 FINDINGS: Cardiomediastinal silhouette unchanged in size and contour. No interlobular septal thickening. Similar appearance of reticulonodular opacities of the lungs with no confluent airspace disease pneumothorax or pleural effusion. IMPRESSION: Mild reticulonodular opacities, compatible with atypical/viral infection. Electronically Signed   By: Gilmer Mor D.O.   On: 11/17/2019 11:21    Scheduled Meds: . amLODipine  10 mg Oral Daily  . aspirin EC  81 mg Oral Daily  . budesonide (PULMICORT) nebulizer solution  0.5 mg Nebulization BID  . chlorpheniramine-HYDROcodone  5 mL Oral Q12H  . doxycycline  100 mg Oral Q12H  . enoxaparin (LOVENOX) injection  40 mg Subcutaneous Q24H  . fluticasone  2 spray Each Nare Daily  . guaiFENesin  1,200 mg Oral BID  . lisinopril  20 mg Oral Daily   And  . hydrochlorothiazide  25 mg Oral Daily  . ipratropium-albuterol  3 mL Nebulization TID  . methylPREDNISolone (SOLU-MEDROL) injection  40 mg Intravenous Q12H  . multivitamin with minerals  1 tablet Oral Daily  . pantoprazole  40 mg Oral BID   Continuous Infusions: . sodium chloride Stopped (11/16/19 0945)  . cefTRIAXone (ROCEPHIN)  IV 2 g (11/17/19 0846)    Assessment/Plan:  1. Bilateral pneumonia.  Continue IV Rocephin.  Switch Zithromax over to doxycycline.  Sputum culture growing rare gram-positive cocci in pairs and clusters and rare gram-negative rods.  Patient has had 2 negative Covid test. 2. Acute hypoxic respiratory failure this has improved patient off oxygen at this point 3. COPD exacerbation.  Continue standing dose Solu-Medrol and nebulizer treatments 4. Essential hypertension on Norvasc and lisinopril HCT 5. Tobacco abuse.  Deferred nicotine patch 6. Obstructive sleep apnea on CPAP 7. GERD  trial of Protonix.  Wondering if the patient's GERD and being on CPAP at night are contributing to the patient's pneumonia.     Code Status:     Code Status Orders  (From admission, onward)         Start     Ordered   11/15/19 0002  Full code  Continuous        11/15/19 0003        Code Status History  This patient has a current code status but no historical code status.   Advance Care Planning Activity     Family Communication: Tried to reach Lupita Leash Disposition Plan: Status is: Inpatient  Dispo: The patient is from: Home              Anticipated d/c is to: Home              Anticipated d/c date is: Evaluate on a day-to-day basis on when to go home              Patient currently had too much bronchospasm today still requires IV Solu-Medrol.  Antibiotics:  Rocephin  Doxycycline  Time spent: 27 minutes  Rosea Dory [employer]

## 2019-11-18 MED ORDER — HYDROCOD POLST-CPM POLST ER 10-8 MG/5ML PO SUER
5.0000 mL | Freq: Two times a day (BID) | ORAL | 0 refills | Status: DC
Start: 2019-11-18 — End: 2019-11-26

## 2019-11-18 MED ORDER — DULERA 100-5 MCG/ACT IN AERO: 2.0000 | INHALATION_SPRAY | Freq: Two times a day (BID) | RESPIRATORY_TRACT | 0 refills | Status: DC

## 2019-11-18 MED ORDER — AMOXICILLIN-POT CLAVULANATE 875-125 MG PO TABS: 1.0000 | ORAL_TABLET | Freq: Two times a day (BID) | ORAL | 0 refills | Status: AC

## 2019-11-18 MED ORDER — PANTOPRAZOLE SODIUM 40 MG PO TBEC
40.0000 mg | DELAYED_RELEASE_TABLET | Freq: Two times a day (BID) | ORAL | 0 refills | Status: DC
Start: 2019-11-18 — End: 2019-12-13

## 2019-11-18 MED ORDER — DOXYCYCLINE HYCLATE 100 MG PO TABS: 100.0000 mg | ORAL_TABLET | Freq: Two times a day (BID) | ORAL | 0 refills | Status: AC

## 2019-11-18 MED ORDER — PREDNISONE 10 MG PO TABS: ORAL_TABLET | ORAL | 0 refills | Status: DC

## 2019-11-18 NOTE — Progress Notes (Signed)
Patient is being discharged home this afternoon. Wife here to transport home. DC & Rx instructions given and patient acknowledged understanding. IV removed.

## 2019-11-18 NOTE — Discharge Summary (Signed)
[facility] [place] [facility]   PATIENT NAME: Kenneth Blair    MR#:  [id number]  DATE OF BIRTH:  [birth year]  DATE OF ADMISSION:  11/14/2019 ADMITTING PHYSICIAN: Alford Highland, MD  DATE OF DISCHARGE: 11/18/2019 12:00 PM  PRIMARY CARE PHYSICIAN: Dorcas Carrow, DO    ADMISSION DIAGNOSIS:  Acute respiratory failure with hypoxia (HCC) [J96.01] Acute hypoxemic respiratory failure (HCC) [J96.01] Community acquired pneumonia of left upper lobe of lung [J18.9]  DISCHARGE DIAGNOSIS:  Principal Problem:   Acute respiratory failure with hypoxia (HCC) Active Problems:   Hyperlipidemia   Hypertension   Sleep apnea   Tobacco abuse   COPD with acute exacerbation (HCC)   Pneumonia of both lungs due to infectious organism   Acute hypoxemic respiratory failure (HCC)   Gastroesophageal reflux disease without esophagitis   SECONDARY DIAGNOSIS:   Past Medical History:  Diagnosis Date  . Head ache   . History of knee problem   . Hyperlipidemia   . Hypertension   . Problems with hearing   . Reflux     HOSPITAL COURSE:   1.  Acute hypoxic respiratory failure.  Pulse ox 88% on room air documented by admitting physician.  Patient was able to be titrated off oxygen during the hospital course. 2.  Bilateral pneumonia.  Patient was started on IV Rocephin during the hospital course and initially given Zithromax but switched over to doxycycline.  Sputum culture still pending.  Patient has improved during the course and stable to go home.  3 more days of Augmentin and 7 more doses of doxycycline upon discharge. 3.  COPD exacerbation.  Patient was given Solu-Medrol and nebulizer treatments here.  Will discharge home on prednisone taper.  Prescribe Dulera inhaler already has albuterol inhaler at home. 4.  Essential hypertension on Norvasc and lisinopril HCT 5.  Tobacco abuse.  Patient must stop smoking 6.  Obstructive sleep apnea on CPAP at night 7.  GERD.  I did give a trial  of Protonix and that feeling in his chest has improved.  Possibility of the patient's GERD acting up at night and being on CPAP which is forcing that back into the lungs.  Continue Protonix twice daily for 1 month then can go to once a day dosing.  If Protonix not covered can use omeprazole over-the-counter.  DISCHARGE CONDITIONS:   Satisfactory  CONSULTS OBTAINED:  None  DRUG ALLERGIES:   Allergies  Allergen Reactions  . Shellfish Allergy Swelling    All sea food    DISCHARGE MEDICATIONS:   Allergies as of 11/18/2019      Reactions   Shellfish Allergy Swelling   All sea food      Medication List    STOP taking these medications   azithromycin 250 MG tablet Commonly known as: ZITHROMAX   famotidine 20 MG tablet Commonly known as: PEPCID   naproxen 500 MG tablet Commonly known as: NAPROSYN     TAKE these medications   albuterol 108 (90 Base) MCG/ACT inhaler Commonly known as: VENTOLIN HFA Inhale 2 puffs into the lungs every 6 (six) hours as needed for wheezing or shortness of breath.   amLODipine 10 MG tablet Commonly known as: NORVASC Take 1 tablet (10 mg total) by mouth daily.   amoxicillin-clavulanate 875-125 MG tablet Commonly known as: Augmentin Take 1 tablet by mouth 2 (two) times daily for 3 days. Start taking on: November 19, 2019   aspirin 81 MG tablet Take 81 mg by mouth daily.  chlorpheniramine-HYDROcodone 10-8 MG/5ML Suer Commonly known as: TUSSIONEX Take 5 mLs by mouth every 12 (twelve) hours.   doxycycline 100 MG tablet Commonly known as: VIBRA-TABS Take 1 tablet (100 mg total) by mouth every 12 (twelve) hours for 7 doses.   Dulera 100-5 MCG/ACT Aero Generic drug: mometasone-formoterol Inhale 2 puffs into the lungs in the morning and at bedtime.   fluticasone 50 MCG/ACT nasal spray Commonly known as: FLONASE Place 2 sprays into both nostrils daily.   lisinopril-hydrochlorothiazide 20-25 MG tablet Commonly known as: ZESTORETIC Take  1 tablet by mouth daily.   Multi-Vitamins Tabs Take 1 tablet by mouth daily.   pantoprazole 40 MG tablet Commonly known as: PROTONIX Take 1 tablet (40 mg total) by mouth 2 (two) times daily.   predniSONE 10 MG tablet Commonly known as: DELTASONE 4 tabs po day1; 3 tabs po day2; 2 tab po day3; 1 tab day4; 1/2 tab po day5,6 Start taking on: November 19, 2019        DISCHARGE INSTRUCTIONS:   Follow-up PMD 5 days  If you experience worsening of your admission symptoms, develop shortness of breath, life threatening emergency, suicidal or homicidal thoughts you must seek medical attention immediately by calling 911 or calling your MD immediately  if symptoms less severe.  You Must read complete instructions/literature along with all the possible adverse reactions/side effects for all the Medicines you take and that have been prescribed to you. Take any new Medicines after you have completely understood and accept all the possible adverse reactions/side effects.   Please note  You were cared for by a hospitalist during your hospital stay. If you have any questions about your discharge medications or the care you received while you were in the hospital after you are discharged, you can call the unit and asked to speak with the hospitalist on call if the hospitalist that took care of you is not available. Once you are discharged, your primary care physician will handle any further medical issues. Please note that NO REFILLS for any discharge medications will be authorized once you are discharged, as it is imperative that you return to your primary care physician (or establish a relationship with a primary care physician if you do not have one) for your aftercare needs so that they can reassess your need for medications and monitor your lab values.    Today   CHIEF COMPLAINT:   Chief Complaint  Patient presents with  . Shortness of Breath  . Cough    HISTORY OF PRESENT ILLNESS:   Kenneth Blair  is a 59 y.o. male came in with shortness of breath and cough and found to have pneumonia   VITAL SIGNS:  Blood pressure 139/89, pulse 60, temperature 97.8 F (36.6 C), temperature source Oral, resp. rate 19, height 6' (1.829 m), weight 117.5 kg, SpO2 95 %.  I/O:    Intake/Output Summary (Last 24 hours) at 11/18/2019 1436 Last data filed at 11/18/2019 1008 Gross per 24 hour  Intake 480 ml  Output 300 ml  Net 180 ml    PHYSICAL EXAMINATION:  GENERAL:  59 y.o.-year-old patient lying in the bed with no acute distress.  EYES:  No scleral icterus.  HEENT: Head atraumatic, normocephalic. Oropharynx and nasopharynx clear.  LUNGS: Decreased breath sounds bilaterally, no wheezing, rales,rhonchi or crepitation. No use of accessory muscles of respiration.  CARDIOVASCULAR: S1, S2 normal. No murmurs, rubs, or gallops.  ABDOMEN: Soft, non-tender. EXTREMITIES: No pedal edema.  NEUROLOGIC: Cranial nerves II through XII  are intact. Muscle strength 5/5 in all extremities. Sensation intact. Gait not checked.  PSYCHIATRIC: The patient is alert and oriented x 3.  SKIN: No obvious rash, lesion, or ulcer.   DATA REVIEW:   CBC Recent Labs  Lab 11/16/19 0410  WBC 14.7*  HGB 13.3  HCT RESULTS UNAVAILABLE DUE TO INTERFERING SUBSTANCE  PLT 267    Chemistries  Recent Labs  Lab 11/17/19 0447  NA 138  K 4.2  CL 101  CO2 30  GLUCOSE 146*  BUN 23*  CREATININE 0.79  CALCIUM 9.0    Microbiology Results  Results for orders placed or performed during the hospital encounter of 11/14/19  Blood Culture (routine x 2)     Status: None (Preliminary result)   Collection Time: 11/14/19 11:18 PM   Specimen: BLOOD  Result Value Ref Range Status   Specimen Description [facility]  Final   Special Requests   Final    BOTTLES DRAWN AEROBIC AND ANAEROBIC Blood Culture adequate volume   Culture   Final    NO GROWTH 4 DAYS Performed at [facility], [address]  [place], [place] [address]    Report Status PENDING  Incomplete  Blood Culture (routine x 2)     Status: None (Preliminary result)   Collection Time: 11/14/19 11:18 PM   Specimen: BLOOD  Result Value Ref Range Status   Specimen Description BLOOD RIGHT FA  Final   Special Requests   Final    BOTTLES DRAWN AEROBIC AND ANAEROBIC Blood Culture adequate volume   Culture   Final    NO GROWTH 4 DAYS Performed at [facility], [address] [place], [place] [address]    Report Status PENDING  Incomplete  Expectorated sputum assessment w rflx to resp cult     Status: None   Collection Time: 11/15/19  7:47 AM   Specimen: Expectorated Sputum  Result Value Ref Range Status   Specimen Description EXPECTORATED SPUTUM  Final   Special Requests Normal  Final   Sputum evaluation   Final    THIS SPECIMEN IS ACCEPTABLE FOR SPUTUM CULTURE Performed at [facility], [address] [place], [place] [address]    Report Status 11/16/2019 FINAL  Final  Culture, respiratory     Status: None (Preliminary result)   Collection Time: 11/15/19  7:47 AM  Result Value Ref Range Status   Specimen Description   Final    EXPECTORATED SPUTUM Performed at [facility], [address] [place], [place] [address]    Special Requests   Final    Normal Reflexed from [id number] Performed at [facility], [address] [place], [place] [address]    Gram Stain   Final    NO WBC SEEN RARE GRAM POSITIVE COCCI IN PAIRS IN CLUSTERS RARE GRAM NEGATIVE RODS    Culture   Final    CULTURE REINCUBATED FOR BETTER GROWTH Performed at [facility], [address] [place], [place] [address]    Report Status PENDING  Incomplete    RADIOLOGY:  DG Chest 2 View  Result Date: 11/17/2019 CLINICAL DATA:  59 year old male with history of smoking and pneumonia EXAM: CHEST - 2 VIEW COMPARISON:  11/14/2019, CT 11/15/2019 FINDINGS: Cardiomediastinal silhouette unchanged in size and contour.  No interlobular septal thickening. Similar appearance of reticulonodular opacities of the lungs with no confluent airspace disease pneumothorax or pleural effusion. IMPRESSION: Mild reticulonodular opacities, compatible with atypical/viral infection. Electronically Signed  By: Gilmer Mor D.O.   On: 11/17/2019 11:21     Management plans discussed with the patient, family and they are in agreement.  CODE STATUS:     Code Status Orders  (From admission, onward)         Start     Ordered   11/15/19 0002  Full code  Continuous        11/15/19 0003        Code Status History    This patient has a current code status but no historical code status.   Advance Care Planning Activity      TOTAL TIME TAKING CARE OF THIS PATIENT: 35 minutes.    Alford Highland M.D on 11/18/2019 at 2:36 PM  Between 7am to 6pm - Pager - [phone]  After 6pm go to [email or web address] - password [email or web address]  [facility]  CC: Primary care physician; Dorcas Carrow, DO

## 2019-11-18 NOTE — Discharge Instructions (Signed)
Community-Acquired Pneumonia, Adult Pneumonia is a type of lung infection that causes swelling in the airways of the lungs. Mucus and fluid may also build up inside the airways. This may cause coughing and difficulty breathing. There are different types of pneumonia. One type can develop while a person is in a hospital. A different type is called community-acquired pneumonia. It develops in people who are not, and have not recently been, in the hospital or another type of health care facility. What are the causes? This condition may be caused by:  Viruses. This is the most common cause of pneumonia.  Bacteria. Community-acquired pneumonia is often caused by Streptococcus pneumoniae bacteria. These bacteria are often passed from one person to another by breathing in droplets from the cough or sneeze of an infected person.  Fungi. This is the least common cause of pneumonia. What increases the risk? The following factors may make you more likely to develop this condition:  Having a chronic disease, such as chronic obstructive pulmonary disease (COPD), asthma, congestive heart failure, cystic fibrosis, diabetes, or kidney disease.  Having early-stage or late-stage HIV.  Having sickle cell disease.  Having had your spleen removed (splenectomy).  Having poor dental hygiene.  Having a medical condition that increases the risk of breathing in (aspirating) secretions from your own mouth and nose.  Having a weakened body defense system (immune system).  Being a smoker.  Traveling to areas where pneumonia-causing germs commonly exist.  Being around animal habitats or animals that have pneumonia-causing germs, including birds, bats, rabbits, cats, and farm animals. What are the signs or symptoms? Symptoms of this condition include:  A dry cough.  A wet (productive) cough.  Fever.  Sweating.  Chest pain, especially when breathing deeply or coughing.  Rapid breathing or difficulty  breathing.  Shortness of breath.  Shaking chills.  Fatigue.  Muscle aches. How is this diagnosed? This condition may be diagnosed based on:  Your medical history.  A physical exam. You may also have tests, including:  Chest X-rays.  Tests of your blood oxygen level and other blood gases.  Tests on blood, mucus (sputum), fluid around your lungs (pleural fluid), and urine. If your pneumonia is severe, other tests may be done to find the exact cause of your illness. How is this treated? Treatment for this condition depends on many factors, such as the cause of your pneumonia, the medicines you take, and other medical conditions that you have. For most adults, treatment and recovery from pneumonia may occur at home. In some cases, treatment must happen in a hospital. Treatment may include:  Medicines that are given by mouth or through an IV, including: ? Antibiotic medicines, if the pneumonia was caused by bacteria. ? Antiviral medicines, if the pneumonia was caused by a virus.  Being given extra oxygen.  Respiratory therapy. Although rare, treating severe pneumonia may include:  Using a machine to help you breathe (mechanical ventilation). This is done if you are not breathing well on your own and you cannot maintain a safe blood oxygen level.  Thoracentesis. This is a procedure to remove fluid from around one lung or both lungs to help you breathe better. Follow these instructions at home:  Medicines  Take over-the-counter and prescription medicines only as told by your health care provider. ? Only take cough medicine if you are losing sleep. Be aware that cough medicine can prevent your body's natural ability to remove mucus from your lungs.  If you were prescribed an antibiotic   medicine, take it as told by your health care provider. Do not stop taking the antibiotic even if you start to feel better. General instructions  Sleep in a semi-upright position at night. Try  sleeping in a reclining chair, or place a few pillows under your head.  Rest as needed and get at least 8 hours of sleep each night.  Drink enough water to keep your urine pale yellow. This will help to thin out mucus secretions in your lungs.  Eat a healthy diet that includes plenty of vegetables, fruits, whole grains, low-fat dairy products, and lean protein.  Do not use any products that contain nicotine or tobacco, such as cigarettes, e-cigarettes, and chewing tobacco. If you need help quitting, ask your health care provider.  Keep all follow-up visits as told by your health care provider. This is important. How is this prevented? You can lower your risk of developing community-acquired pneumonia by:  Getting a pneumococcal vaccine. There are different types and schedules of pneumococcal vaccines. Ask your health care provider which option is best for you. Consider getting the vaccine if: ? You are older than 59 years of age. ? You are older than 59 years of age and are undergoing cancer treatment, have chronic lung disease, or have other medical conditions that affect your immune system. Ask your health care provider if this applies to you.  Getting an influenza vaccine every year. Ask your health care provider which type of vaccine is best for you.  Getting regular checkups from your dentist.  Washing your hands often. If soap and water are not available, use hand sanitizer. Contact a health care provider if:  You have a fever.  You are losing sleep because you cannot control your cough with cough medicine. Get help right away if:  You have worsening shortness of breath.  You have increased chest pain.  Your sickness becomes worse, especially if you are an older adult or have a weakened immune system.  You cough up blood. Summary  Pneumonia is an infection of the lungs.  Community-acquired pneumonia develops in people who have not been in the hospital. It can be caused  by bacteria, viruses, or fungi.  This condition may be treated with antibiotics or antiviral medicines.  Severe cases may require hospitalization, mechanical ventilation, and other procedures to drain fluid from the lungs. This information is not intended to replace advice given to you by your health care provider. Make sure you discuss any questions you have with your health care provider. Document Revised: 10/13/2017 Document Reviewed: 10/13/2017 [employer] Patient Education  2020 [employer]  

## 2019-11-19 LAB — CULTURE, RESPIRATORY W GRAM STAIN
Gram Stain: NONE SEEN
Special Requests: NORMAL

## 2019-11-19 LAB — CULTURE, BLOOD (ROUTINE X 2)
Culture: NO GROWTH
Culture: NO GROWTH
Special Requests: ADEQUATE
Special Requests: ADEQUATE

## 2019-11-26 ENCOUNTER — Ambulatory Visit: Payer: BC Managed Care – PPO | Admitting: Family Medicine

## 2019-11-26 ENCOUNTER — Encounter: Payer: Self-pay | Admitting: Family Medicine

## 2019-11-26 ENCOUNTER — Other Ambulatory Visit: Payer: Self-pay

## 2019-11-26 VITALS — BP 102/59 | HR 74 | Temp 98.5°F | Wt 263.0 lb

## 2019-11-26 DIAGNOSIS — Z23 Encounter for immunization: Secondary | ICD-10-CM | POA: Diagnosis not present

## 2019-11-26 DIAGNOSIS — J189 Pneumonia, unspecified organism: Secondary | ICD-10-CM

## 2019-11-26 DIAGNOSIS — B370 Candidal stomatitis: Secondary | ICD-10-CM | POA: Diagnosis not present

## 2019-11-26 DIAGNOSIS — R5382 Chronic fatigue, unspecified: Secondary | ICD-10-CM | POA: Diagnosis not present

## 2019-11-26 DIAGNOSIS — K921 Melena: Secondary | ICD-10-CM

## 2019-11-26 DIAGNOSIS — I952 Hypotension due to drugs: Secondary | ICD-10-CM | POA: Diagnosis not present

## 2019-11-26 MED ORDER — DOXYCYCLINE HYCLATE 100 MG PO TABS: 100.0000 mg | ORAL_TABLET | Freq: Two times a day (BID) | ORAL | 0 refills | Status: DC

## 2019-11-26 MED ORDER — MAGIC MOUTHWASH W/LIDOCAINE: 5.0000 mL | Freq: Three times a day (TID) | ORAL | 0 refills | Status: AC | PRN

## 2019-11-26 NOTE — Progress Notes (Signed)
BP (!) 102/59   Pulse 74   Temp 98.5 F (36.9 C) (Oral)   Wt 263 lb (119.3 kg)   SpO2 93%   BMI 35.67 kg/m    Subjective:    Patient ID: Kenneth Blair, male    DOB: [birth year], 59 y.o.   MRN: [id number]  HPI: Kenneth Blair is a 59 y.o. male  Chief Complaint  Patient presents with  . Hospitalization Follow-up   Transition of Care Hospital Follow up.   Hospital/Facility: [facility] D/C Physician: Dr. Renae Gloss  D/C Date: 11/18/19  Records Requested:  11/26/19 Records Received:  11/26/19 Records Reviewed:  11/26/19  Diagnoses on Discharge:   Acute respiratory failure with hypoxia [facility])  Date of interactive Contact within 48 hours of discharge: Not Done  Date of 7 day or 14 day face-to-face visit:  11/26/19  within 14 days  Outpatient Encounter Medications as of 11/26/2019  Medication Sig  . albuterol (VENTOLIN HFA) 108 (90 Base) MCG/ACT inhaler Inhale 2 puffs into the lungs every 6 (six) hours as needed for wheezing or shortness of breath.  Marland Kitchen amLODipine (NORVASC) 10 MG tablet Take 1 tablet (10 mg total) by mouth daily.  Marland Kitchen aspirin 81 MG tablet Take 81 mg by mouth daily.  . fluticasone (FLONASE) 50 MCG/ACT nasal spray Place 2 sprays into both nostrils daily.  Marland Kitchen lisinopril-hydrochlorothiazide (ZESTORETIC) 20-25 MG tablet Take 1 tablet by mouth daily.  . [DISCONTINUED] Multiple Vitamin (MULTI-VITAMINS) TABS Take 1 tablet by mouth daily.   Marland Kitchen doxycycline (VIBRA-TABS) 100 MG tablet Take 1 tablet (100 mg total) by mouth 2 (two) times daily.  . magic mouthwash w/lidocaine SOLN Take 5 mLs by mouth 3 (three) times daily as needed for up to 10 days for mouth pain.  . mometasone-formoterol (DULERA) 100-5 MCG/ACT AERO Inhale 2 puffs into the lungs in the morning and at bedtime. (Patient not taking: Reported on 11/26/2019)  . pantoprazole (PROTONIX) 40 MG tablet Take 1 tablet (40 mg total) by mouth 2 (two) times daily. (Patient not taking: Reported on 11/26/2019)  . [DISCONTINUED]  chlorpheniramine-HYDROcodone (TUSSIONEX) 10-8 MG/5ML SUER Take 5 mLs by mouth every 12 (twelve) hours.  . [DISCONTINUED] predniSONE (DELTASONE) 10 MG tablet 4 tabs po day1; 3 tabs po day2; 2 tab po day3; 1 tab day4; 1/2 tab po day5,6   No facility-administered encounter medications on file as of 11/26/2019.  Per hospitalist: " HOSPITAL COURSE:   1.  Acute hypoxic respiratory failure.  Pulse ox 88% on room air documented by admitting physician.  Patient was able to be titrated off oxygen during the hospital course. 2.  Bilateral pneumonia.  Patient was started on IV Rocephin during the hospital course and initially given Zithromax but switched over to doxycycline.  Sputum culture still pending.  Patient has improved during the course and stable to [facility] home.  3 more days of Augmentin and 7 more doses of doxycycline upon discharge. 3.  COPD exacerbation.  Patient was given Solu-Medrol and nebulizer treatments here.  Will discharge home on prednisone taper.  Prescribe Dulera inhaler already has albuterol inhaler at home. 4.  Essential hypertension on Norvasc and lisinopril HCT 5.  Tobacco abuse.  Patient must stop smoking 6.  Obstructive sleep apnea on CPAP at night 7.  GERD.  I did give a trial of Protonix and that feeling in his chest has improved.  Possibility of the patient's GERD acting up at night and being on CPAP which is forcing that back into the lungs.  Continue Protonix twice daily for 1 month then can Blair to once a day dosing.  If Protonix not covered can use omeprazole over-the-counter."   Diagnostic Tests Reviewed:  CLINICAL DATA:  Shortness of breath and cough  EXAM: CHEST - 2 VIEW  COMPARISON:  November 11, 2019  FINDINGS: There is ill-defined airspace opacity in the left upper lobe. Lungs otherwise are clear. Heart size and pulmonary vascularity are normal. No adenopathy. No bone lesions.  IMPRESSION: Ill-defined airspace opacity left upper lobe without  consolidation. This focus is consistent with pneumonia, potentially of atypical organism etiology. In this regard, advise check of COVID-19 status.  Lungs elsewhere clear. Heart size and pulmonary vascularity normal. No adenopathy.   Electronically Signed   By: Bretta Bang III M.D.   On: 11/14/2019 11:53  CLINICAL DATA:  Cough and short of breath for 2 weeks  EXAM: CT CHEST WITH CONTRAST  TECHNIQUE: Multidetector CT imaging of the chest was performed during intravenous contrast administration.  CONTRAST:  33mL OMNIPAQUE IOHEXOL 300 MG/ML  SOLN  COMPARISON:  11/14/2019  FINDINGS: Cardiovascular: The heart and great vessels are unremarkable without pericardial effusion. Moderate atherosclerosis of the coronary vasculature most pronounced in the LAD distribution. Normal caliber of the thoracic aorta, with minimal atherosclerosis. No evidence of dissection.  Mediastinum/Nodes: No enlarged mediastinal, hilar, or axillary lymph nodes. Thyroid gland, trachea, and esophagus demonstrate no significant findings.  Lungs/Pleura: Scattered bilateral ground-glass airspace disease most pronounced throughout the left upper lobe. No effusion or pneumothorax. The central airways are patent.  Upper Abdomen: Benign left adrenal adenoma. No acute upper abdominal findings.  Musculoskeletal: No acute or destructive bony lesions. Reconstructed images demonstrate no additional findings.  IMPRESSION: 1. Patchy bilateral ground-glass airspace disease most pronounced in the left upper lobe. Findings are consistent with multifocal pneumonia, in a pattern typical for COVID-19. 2. Aortic Atherosclerosis (ICD10-I70.0). Coronary artery atherosclerosis   Electronically Signed   By: Sharlet Salina M.D.   On: 11/15/2019 00:34  CLINICAL DATA:  59 year old male with history of smoking and pneumonia  EXAM: CHEST - 2 VIEW  COMPARISON:  11/14/2019, CT  11/15/2019  FINDINGS: Cardiomediastinal silhouette unchanged in size and contour.  No interlobular septal thickening.  Similar appearance of reticulonodular opacities of the lungs with no confluent airspace disease pneumothorax or pleural effusion.  IMPRESSION: Mild reticulonodular opacities, compatible with atypical/viral infection.   Electronically Signed   By: Gilmer Mor D.O.   On: 11/17/2019 11:21  Disposition: Home  Consults: None  Discharge Instructions: Follow up here  Disease/illness Education: Discussed today  Home Health/Community Services Discussions/Referrals: N/A  Establishment or re-establishment of referral orders for community resources: N/A  Discussion with other health care providers: N/A  Assessment and Support of treatment regimen adherence: Good  Appointments Coordinated with: Patient and wife  Education for self-management, independent living, and ADLs: Discussed today  Has been feeling better since he got out of the hospital. He has been very tired. He notes that his breathing has been doing OK. He feels like he becomes very tired with minimal effort. He has not been coughing. No fevers. No chills.   RECTAL BLEEDING- He does note that he has been having black stools since he got out of the hospital Duration: about a week Bright red rectal bleeding: no  Amount of blood: unsure  Frequency: with bowel movements Melena: yes  Spotting on toilet tissue: no  Anal fullness: no  Perianal pain: no  Perianal irritation/itching: no  Constipation: no  Chronic straining/valsava:  no  Anal trauma/intercourse: no  Hemorrhoids: no  Previous colonoscopy: yes   HYPOTENSION Hypertension status: exacerbated  Satisfied with current treatment? no Duration of hypertension: chronic BP monitoring frequency:  a few times a week BP range: 90s/50s BP medication side effects:  no Medication compliance: excellent compliance Previous BP meds:  amlodipine, lisinopril-HCTZ Aspirin: yes Recurrent headaches: no Visual changes: no Palpitations: yes Dyspnea: no Chest pain: no Lower extremity edema: no Dizzy/lightheaded: yes  He has been having lesions in his mouth since he got out of the hospital. Has not gotten much better. It has been making it hard for him to eat.   He is otherwise doing OK with no other concerns or complaints at this time.   Relevant past medical, surgical, family and social history reviewed and updated as indicated. Interim medical history since our last visit reviewed. Allergies and medications reviewed and updated.  Review of Systems  Constitutional: Positive for fatigue. Negative for activity change, appetite change, chills, diaphoresis, fever and unexpected weight change.  HENT: Negative.   Respiratory: Negative.   Cardiovascular: Negative.   Gastrointestinal: Positive for blood in stool. Negative for abdominal distention, abdominal pain, anal bleeding, constipation, diarrhea, nausea, rectal pain and vomiting.  Genitourinary: Negative.   Musculoskeletal: Negative.   Skin: Negative.   Neurological: Positive for dizziness and light-headedness. Negative for tremors, seizures, syncope, facial asymmetry, speech difficulty, weakness, numbness and headaches.  Psychiatric/Behavioral: Negative.     Per HPI unless specifically indicated above     Objective:    BP (!) 102/59   Pulse 74   Temp 98.5 F (36.9 C) (Oral)   Wt 263 lb (119.3 kg)   SpO2 93%   BMI 35.67 kg/m   Wt Readings from Last 3 Encounters:  11/26/19 263 lb (119.3 kg)  11/14/19 259 lb (117.5 kg)  11/11/19 259 lb (117.5 kg)    Physical Exam Vitals and nursing note reviewed.  Constitutional:      General: He is not in acute distress.    Appearance: Normal appearance. He is not ill-appearing, toxic-appearing or diaphoretic.  HENT:     Head: Normocephalic and atraumatic.     Right Ear: External ear normal.     Left Ear: External ear  normal.     Nose: Nose normal.     Mouth/Throat:     Mouth: Mucous membranes are moist.     Pharynx: Oropharynx is clear.     Comments: White film on tongue with cracking Eyes:     General: No scleral icterus.       Right eye: No discharge.        Left eye: No discharge.     Extraocular Movements: Extraocular movements intact.     Conjunctiva/sclera: Conjunctivae normal.     Pupils: Pupils are equal, round, and reactive to light.  Cardiovascular:     Rate and Rhythm: Normal rate and regular rhythm.     Pulses: Normal pulses.     Heart sounds: Normal heart sounds. No murmur heard.  No friction rub. No gallop.   Pulmonary:     Effort: Pulmonary effort is normal. No respiratory distress.     Breath sounds: No stridor. Rhonchi (bilateral bases) present. No wheezing or rales.  Chest:     Chest wall: No tenderness.  Musculoskeletal:        General: Normal range of motion.     Cervical back: Normal range of motion and neck supple.  Skin:    General: Skin is warm  and dry.     Capillary Refill: Capillary refill takes less than 2 seconds.     Coloration: Skin is not jaundiced or pale.     Findings: No bruising, erythema, lesion or rash.  Neurological:     General: No focal deficit present.     Mental Status: He is alert and oriented to person, place, and time. Mental status is at baseline.  Psychiatric:        Mood and Affect: Mood normal.        Behavior: Behavior normal.        Thought Content: Thought content normal.        Judgment: Judgment normal.     Results for orders placed or performed during the hospital encounter of 11/14/19  Blood Culture (routine x 2)   Specimen: BLOOD  Result Value Ref Range   Specimen Description BLOOD LEFT AC    Special Requests      BOTTLES DRAWN AEROBIC AND ANAEROBIC Blood Culture adequate volume   Culture      NO GROWTH 5 DAYS Performed at [facility], [address] [place], [place] [address]    Report Status 11/19/2019  FINAL   Blood Culture (routine x 2)   Specimen: BLOOD  Result Value Ref Range   Specimen Description BLOOD RIGHT FA    Special Requests      BOTTLES DRAWN AEROBIC AND ANAEROBIC Blood Culture adequate volume   Culture      NO GROWTH 5 DAYS Performed at [facility], [address] [place], [place] [address]    Report Status 11/19/2019 FINAL   Expectorated sputum assessment w rflx to resp cult   Specimen: Expectorated Sputum  Result Value Ref Range   Specimen Description EXPECTORATED SPUTUM    Special Requests Normal    Sputum evaluation      THIS SPECIMEN IS ACCEPTABLE FOR SPUTUM CULTURE Performed at [facility], [address] [place], [place] [address]    Report Status 11/16/2019 FINAL   Culture, respiratory  Result Value Ref Range   Specimen Description      EXPECTORATED SPUTUM Performed at [facility], [address] [place], [place] [address]    Special Requests      Normal Reflexed from [id number] Performed at [facility], [address] [place], [place] [address]    Gram Stain      NO WBC SEEN RARE GRAM POSITIVE COCCI IN PAIRS IN CLUSTERS RARE GRAM NEGATIVE RODS Performed at [facility], [address] [place], [place] [address]    Culture FEW CANDIDA ALBICANS    Report Status 11/19/2019 FINAL   Basic metabolic panel  Result Value Ref Range   Sodium 134 (L) 135 - 145 mmol/L   Potassium 3.7 3.5 - 5.1 mmol/L   Chloride 98 98 - 111 mmol/L   CO2 26 22 - 32 mmol/L   Glucose, Bld 110 (H) 70 - 99 mg/dL   BUN 16 6 - 20 mg/dL   Creatinine, Ser 6.290.91 0.61 - 1.24 mg/dL   Calcium 9.2 8.9 - 52.810.3 mg/dL   GFR calc non Af Amer >60 >60 mL/min   GFR calc Af Amer >60 >60 mL/min   Anion gap 10 5 - 15  CBC  Result Value Ref Range   WBC 11.5 (H) 4.0 - 10.5 K/uL   RBC 4.48 4.22 - 5.81 MIL/uL   Hemoglobin 14.9 13.0 - 17.0 g/dL   HCT 41.340.3 39 - 52 %  MCV 90.0 80.0 - 100.0 fL   MCH 33.3 26.0 - 34.0 pg   MCHC 37.0 (H) 30.0 -  36.0 g/dL   RDW 16.1 09.6 - 04.5 %   Platelets 236 150 - 400 K/uL   nRBC 0.0 0.0 - 0.2 %  Lactic acid, plasma  Result Value Ref Range   Lactic Acid, Venous 1.9 0.5 - 1.9 mmol/L  HIV Antibody (routine testing w rflx)  Result Value Ref Range   HIV Screen 4th Generation wRfx Non Reactive Non Reactive  Basic metabolic panel  Result Value Ref Range   Sodium 135 135 - 145 mmol/L   Potassium 4.1 3.5 - 5.1 mmol/L   Chloride 98 98 - 111 mmol/L   CO2 25 22 - 32 mmol/L   Glucose, Bld 165 (H) 70 - 99 mg/dL   BUN 18 6 - 20 mg/dL   Creatinine, Ser 4.09 0.61 - 1.24 mg/dL   Calcium 9.2 8.9 - 81.1 mg/dL   GFR calc non Af Amer >60 >60 mL/min   GFR calc Af Amer >60 >60 mL/min   Anion gap 12 5 - 15  CBC  Result Value Ref Range   WBC 16.8 (H) 4.0 - 10.5 K/uL   RBC 4.47 4.22 - 5.81 MIL/uL   Hemoglobin 14.7 13.0 - 17.0 g/dL   HCT 91.4 39 - 52 %   MCV 89.3 80.0 - 100.0 fL   MCH 32.9 26.0 - 34.0 pg   MCHC 36.8 (H) 30.0 - 36.0 g/dL   RDW 78.2 95.6 - 21.3 %   Platelets 235 150 - 400 K/uL   nRBC 0.0 0.0 - 0.2 %  Procalcitonin  Result Value Ref Range   Procalcitonin 0.17 ng/mL  CBC  Result Value Ref Range   WBC 14.7 (H) 4.0 - 10.5 K/uL   RBC RESULTS UNAVAILABLE DUE TO INTERFERING SUBSTANCE 4.22 - 5.81 MIL/uL   Hemoglobin 13.3 13.0 - 17.0 g/dL   HCT RESULTS UNAVAILABLE DUE TO INTERFERING SUBSTANCE 39 - 52 %   MCV RESULTS UNAVAILABLE DUE TO INTERFERING SUBSTANCE 80.0 - 100.0 fL   MCH RESULTS UNAVAILABLE DUE TO INTERFERING SUBSTANCE 26.0 - 34.0 pg   MCHC RESULTS UNAVAILABLE DUE TO INTERFERING SUBSTANCE 30.0 - 36.0 g/dL   RDW RESULTS UNAVAILABLE DUE TO INTERFERING SUBSTANCE 11.5 - 15.5 %   Platelets 267 150 - 400 K/uL   nRBC 0.0 0.0 - 0.2 %  Procalcitonin  Result Value Ref Range   Procalcitonin <0.10 ng/mL  Brain natriuretic peptide  Result Value Ref Range   B Natriuretic Peptide 53.8 0.0 - 100.0 pg/mL  Basic metabolic panel  Result Value Ref Range   Sodium 138 135 - 145 mmol/L   Potassium  4.2 3.5 - 5.1 mmol/L   Chloride 101 98 - 111 mmol/L   CO2 30 22 - 32 mmol/L   Glucose, Bld 146 (H) 70 - 99 mg/dL   BUN 23 (H) 6 - 20 mg/dL   Creatinine, Ser 0.86 0.61 - 1.24 mg/dL   Calcium 9.0 8.9 - 57.8 mg/dL   GFR calc non Af Amer >60 >60 mL/min   GFR calc Af Amer >60 >60 mL/min   Anion gap 7 5 - 15  Troponin I (High Sensitivity)  Result Value Ref Range   Troponin I (High Sensitivity) 6 <18 ng/L  Troponin I (High Sensitivity)  Result Value Ref Range   Troponin I (High Sensitivity) 4 <18 ng/L      Assessment & Plan:   Problem List Items Addressed  This Visit    None    Visit Diagnoses    Multifocal pneumonia    -  Primary   Continues with crackles in bilateral bases. Will treat with doxycycline and recheck 2 weeks. Checking COVID antibodies. Continue to monitor.    Relevant Medications   doxycycline (VIBRA-TABS) 100 MG tablet   magic mouthwash w/lidocaine SOLN   Other Relevant Orders   SARS-CoV-2 Semi-Quantitative Total Antibody, Spike   CBC with Differential/Platelet   Basic metabolic panel   Hypotension due to drugs       Will cut his amlodipine in 1/2 and recheck 2 weeks. Call with any concerns.    Relevant Orders   CBC with Differential/Platelet   Basic metabolic panel   Chronic fatigue       Checking labs today. Likely multifactorial. Await results.    Relevant Orders   CBC with Differential/Platelet   Basic metabolic panel   Thrush       Will treat with magic mouthwash. Call with any concerns. Continue to monitor.    Relevant Medications   magic mouthwash w/lidocaine SOLN   Melena       Will check blood count and FOBT. Await results.    Relevant Orders   Fecal occult blood, imunochemical([facility])   Flu vaccine need       Flu shot given today.    Relevant Orders   Flu Vaccine QUAD 36+ mos IM (Completed)       Follow up plan: Return in about 2 weeks (around 12/10/2019).   >40 minutes spent with patient today.

## 2019-11-27 ENCOUNTER — Telehealth: Payer: Self-pay

## 2019-11-27 LAB — BASIC METABOLIC PANEL
BUN/Creatinine Ratio: 12 (ref 9–20)
BUN: 13 mg/dL (ref 6–24)
CO2: 25 mmol/L (ref 20–29)
Calcium: 9 mg/dL (ref 8.7–10.2)
Chloride: 100 mmol/L (ref 96–106)
Creatinine, Ser: 1.12 mg/dL (ref 0.76–1.27)
GFR calc Af Amer: 83 mL/min/{1.73_m2} (ref >59)
GFR calc non Af Amer: 72 mL/min/{1.73_m2} (ref >59)
Glucose: 100 mg/dL — ABNORMAL HIGH (ref 65–99)
Potassium: 3.9 mmol/L (ref 3.5–5.2)
Sodium: 137 mmol/L (ref 134–144)

## 2019-11-27 LAB — CBC WITH DIFFERENTIAL/PLATELET
Basophils Absolute: 0.1 10*3/uL (ref 0.0–0.2)
Basos: 0 %
EOS (ABSOLUTE): 0.2 10*3/uL (ref 0.0–0.4)
Eos: 2 %
Hematocrit: 42.3 % (ref 37.5–51.0)
Hemoglobin: 14.4 g/dL (ref 13.0–17.7)
Immature Grans (Abs): 0.1 10*3/uL (ref 0.0–0.1)
Immature Granulocytes: 1 %
Lymphocytes Absolute: 2.5 10*3/uL (ref 0.7–3.1)
Lymphs: 20 %
MCH: 32.7 pg (ref 26.6–33.0)
MCHC: 34 g/dL (ref 31.5–35.7)
MCV: 96 fL (ref 79–97)
Monocytes Absolute: 0.9 10*3/uL (ref 0.1–0.9)
Monocytes: 7 %
Neutrophils Absolute: 8.5 10*3/uL — ABNORMAL HIGH (ref 1.4–7.0)
Neutrophils: 70 %
Platelets: 210 10*3/uL (ref 150–450)
RBC: 4.4 x10E6/uL (ref 4.14–5.80)
RDW: 13 % (ref 11.6–15.4)
WBC: 12.2 10*3/uL — ABNORMAL HIGH (ref 3.4–10.8)

## 2019-11-27 LAB — SARS-COV-2 SEMI-QUANTITATIVE TOTAL ANTIBODY, SPIKE
SARS-CoV-2 Semi-Quant Total Ab: <0.4 U/mL (ref <0.8)
SARS-CoV-2 Spike Ab Interp: NEGATIVE

## 2019-11-27 NOTE — Telephone Encounter (Signed)
Pt. Given Dr. Henriette Combs message. Verbalizes understanding.

## 2019-11-27 NOTE — Telephone Encounter (Signed)
NA. Mail box is full 

## 2019-11-27 NOTE — Telephone Encounter (Signed)
-----   Message from Dorcas Carrow, DO sent at 11/27/2019  1:57 PM EDT ----- Please let Kenneth Blair know that he has no covid antibodies. His white blood count is still up a little bit but everything else looks good.

## 2019-12-13 ENCOUNTER — Encounter: Payer: Self-pay | Admitting: Family Medicine

## 2019-12-13 ENCOUNTER — Ambulatory Visit (INDEPENDENT_AMBULATORY_CARE_PROVIDER_SITE_OTHER): Payer: BC Managed Care – PPO | Admitting: Family Medicine

## 2019-12-13 ENCOUNTER — Other Ambulatory Visit: Payer: Self-pay

## 2019-12-13 VITALS — BP 122/75 | HR 73 | Temp 98.5°F | Ht 72.0 in | Wt 266.0 lb

## 2019-12-13 DIAGNOSIS — J441 Chronic obstructive pulmonary disease with (acute) exacerbation: Secondary | ICD-10-CM

## 2019-12-13 DIAGNOSIS — R059 Cough, unspecified: Secondary | ICD-10-CM

## 2019-12-13 DIAGNOSIS — I10 Essential (primary) hypertension: Secondary | ICD-10-CM

## 2019-12-13 DIAGNOSIS — M7021 Olecranon bursitis, right elbow: Secondary | ICD-10-CM

## 2019-12-13 DIAGNOSIS — J189 Pneumonia, unspecified organism: Secondary | ICD-10-CM | POA: Diagnosis not present

## 2019-12-13 MED ORDER — DULERA 100-5 MCG/ACT IN AERO
2.0000 | INHALATION_SPRAY | Freq: Two times a day (BID) | RESPIRATORY_TRACT | 0 refills | Status: DC
Start: 2019-12-13 — End: 2020-01-23

## 2019-12-13 NOTE — Assessment & Plan Note (Signed)
Resolved with doxycycline, but now with new illness. Will monitor closely.

## 2019-12-13 NOTE — Assessment & Plan Note (Signed)
Will treat with prednisone and azithromycin. Recheck about 1 week. Encouraged patient to use his dulera. Call with any concerns.

## 2019-12-13 NOTE — Assessment & Plan Note (Signed)
Under good control on current regimen. Continue current regimen. Continue to monitor. Call with any concerns. Refills given.   

## 2019-12-13 NOTE — Progress Notes (Signed)
BP 122/75 (BP Location: Right Arm, Patient Position: Sitting)   Pulse 73   Temp 98.5 F (36.9 C) (Oral)   Ht 6' (1.829 m)   Wt 266 lb (120.7 kg)   SpO2 95%   BMI 36.08 kg/m    Subjective:    Patient ID: Kenneth Blair, male    DOB: 09/29/1960, 59 y.o.   MRN: [id number]  HPI: Kenneth Blair is a 59 y.o. male  Chief Complaint  Patient presents with  . Pneumonia    follow up- still feeling bad, has chest cold, has been taking mucinex, cough   . Hypotension  . fluid in elbow    right elbow, not painful, just enlarged area    UPPER RESPIRATORY TRACT INFECTION- Was better for about a week, then started to feel sick again.  Duration: about a week Worst symptom: cough and congestion Fever: no Cough: yes Shortness of breath: no Wheezing: yes Chest pain: no Chest tightness: yes Chest congestion: yes Nasal congestion: yes Runny nose: yes Post nasal drip: yes Sneezing: no Sore throat: no Swollen glands: no Sinus pressure: no Headache: no Face pain: no Toothache: no Ear pain: no  Ear pressure: no  Eyes red/itching:no Eye drainage/crusting: yes  Vomiting: no Rash: no Fatigue: yes Sick contacts: yes Strep contacts: no  Context: worse Recurrent sinusitis: no Relief with OTC cold/cough medications: no  Treatments attempted: mucinex   HYPERTENSION- had to Blair back up to his whole dose of amlodipine at the beginning of the week because it was running a little high Hypertension status: better  Satisfied with current treatment? yes Duration of hypertension: chronic BP monitoring frequency:  a few times a week BP medication side effects:  no Medication compliance: excellent compliance Previous BP meds:amlodipine, lisinopril-HCTZ Aspirin: yes Recurrent headaches: no Visual changes: no Palpitations: no Dyspnea: no Chest pain: no Lower extremity edema: no Dizzy/lightheaded: no   Swollen R elbow for about 3 weeks. No redness or heat or fevers or  chills.  Relevant past medical, surgical, family and social history reviewed and updated as indicated. Interim medical history since our last visit reviewed. Allergies and medications reviewed and updated.  Review of Systems  Constitutional: Positive for fatigue. Negative for activity change, appetite change, chills, diaphoresis, fever and unexpected weight change.  HENT: Positive for congestion, postnasal drip and rhinorrhea. Negative for dental problem, drooling, ear discharge, ear pain, facial swelling, hearing loss, mouth sores, nosebleeds, sinus pressure, sinus pain, sneezing, sore throat, tinnitus, trouble swallowing and voice change.   Respiratory: Positive for cough and wheezing. Negative for apnea, choking, chest tightness, shortness of breath and stridor.   Cardiovascular: Negative.   Musculoskeletal: Negative.   Psychiatric/Behavioral: Negative.     Per HPI unless specifically indicated above     Objective:    BP 122/75 (BP Location: Right Arm, Patient Position: Sitting)   Pulse 73   Temp 98.5 F (36.9 C) (Oral)   Ht 6' (1.829 m)   Wt 266 lb (120.7 kg)   SpO2 95%   BMI 36.08 kg/m   Wt Readings from Last 3 Encounters:  12/13/19 266 lb (120.7 kg)  11/26/19 263 lb (119.3 kg)  11/14/19 259 lb (117.5 kg)    Physical Exam Vitals and nursing note reviewed.  Constitutional:      General: He is not in acute distress.    Appearance: Normal appearance. He is not ill-appearing, toxic-appearing or diaphoretic.  HENT:     Head: Normocephalic and atraumatic.  Right Ear: External ear normal.     Left Ear: External ear normal.     Nose: Nose normal.     Mouth/Throat:     Mouth: Mucous membranes are moist.     Pharynx: Oropharynx is clear.  Eyes:     General: No scleral icterus.       Right eye: No discharge.        Left eye: No discharge.     Extraocular Movements: Extraocular movements intact.     Conjunctiva/sclera: Conjunctivae normal.     Pupils: Pupils are equal,  round, and reactive to light.  Cardiovascular:     Rate and Rhythm: Normal rate and regular rhythm.     Pulses: Normal pulses.     Heart sounds: Normal heart sounds. No murmur heard.  No friction rub. No gallop.   Pulmonary:     Effort: Pulmonary effort is normal. No respiratory distress.     Breath sounds: No stridor. Wheezing and rhonchi present. No rales.  Chest:     Chest wall: No tenderness.  Musculoskeletal:        General: Normal range of motion.     Cervical back: Normal range of motion and neck supple.     Comments: Olecranon bursitis on the R elbow, no redness or heat  Skin:    General: Skin is warm and dry.     Capillary Refill: Capillary refill takes less than 2 seconds.     Coloration: Skin is not jaundiced or pale.     Findings: No bruising, erythema, lesion or rash.  Neurological:     General: No focal deficit present.     Mental Status: He is alert and oriented to person, place, and time. Mental status is at baseline.  Psychiatric:        Mood and Affect: Mood normal.        Behavior: Behavior normal.        Thought Content: Thought content normal.        Judgment: Judgment normal.     Results for orders placed or performed in visit on 11/26/19  SARS-CoV-2 Semi-Quantitative Total Antibody, Spike  Result Value Ref Range   SARS-CoV-2 Semi-Quant Total Ab <0.4 Negative<0.8 U/mL   SARS-CoV-2 Spike Ab Interp Negative   CBC with Differential/Platelet  Result Value Ref Range   WBC 12.2 (H) 3.4 - 10.8 x10E3/uL   RBC 4.40 4.14 - 5.80 x10E6/uL   Hemoglobin 14.4 13.0 - 17.7 g/dL   Hematocrit 22.6 33.3 - 51.0 %   MCV 96 79 - 97 fL   MCH 32.7 26.6 - 33.0 pg   MCHC 34.0 31 - 35 g/dL   RDW 54.5 62.5 - 63.8 %   Platelets 210 150 - 450 x10E3/uL   Neutrophils 70 Not Estab. %   Lymphs 20 Not Estab. %   Monocytes 7 Not Estab. %   Eos 2 Not Estab. %   Basos 0 Not Estab. %   Neutrophils Absolute 8.5 (H) 1 - 7 x10E3/uL   Lymphocytes Absolute 2.5 0 - 3 x10E3/uL    Monocytes Absolute 0.9 0 - 0 x10E3/uL   EOS (ABSOLUTE) 0.2 0.0 - 0.4 x10E3/uL   Basophils Absolute 0.1 0 - 0 x10E3/uL   Immature Granulocytes 1 Not Estab. %   Immature Grans (Abs) 0.1 0.0 - 0.1 x10E3/uL  Basic metabolic panel  Result Value Ref Range   Glucose 100 (H) 65 - 99 mg/dL   BUN 13 6 - 24 mg/dL  Creatinine, Ser 1.12 0.76 - 1.27 mg/dL   GFR calc non Af Amer 72 >59 mL/min/1.73   GFR calc Af Amer 83 >59 mL/min/1.73   BUN/Creatinine Ratio 12 9 - 20   Sodium 137 134 - 144 mmol/L   Potassium 3.9 3.5 - 5.2 mmol/L   Chloride 100 96 - 106 mmol/L   CO2 25 20 - 29 mmol/L   Calcium 9.0 8.7 - 10.2 mg/dL      Assessment & Plan:   Problem List Items Addressed This Visit      Cardiovascular and Mediastinum   Hypertension - Primary    Under good control on current regimen. Continue current regimen. Continue to monitor. Call with any concerns. Refills given.          Respiratory   COPD with acute exacerbation (HCC)    Will treat with prednisone and azithromycin. Recheck about 1 week. Encouraged patient to use his dulera. Call with any concerns.       Relevant Medications   mometasone-formoterol (DULERA) 100-5 MCG/ACT AERO   Pneumonia of both lungs due to infectious organism    Resolved with doxycycline, but now with new illness. Will monitor closely.      Relevant Medications   mometasone-formoterol (DULERA) 100-5 MCG/ACT AERO    Other Visit Diagnoses    Cough       Given resolution and restarting symptoms, will check COVID swab. Self-quarantine until results are back. Call with any concerns.    Relevant Orders   Novel Coronavirus, NAA ([facility])   Olecranon bursitis of right elbow       Will get him back for injection next week.        Follow up plan: Return in about 1 week (around 12/20/2019) for olecranon bursitis drainage.

## 2019-12-15 LAB — NOVEL CORONAVIRUS, NAA: SARS-CoV-2, NAA: NOT DETECTED

## 2019-12-15 LAB — SARS-COV-2, NAA 2 DAY TAT

## 2019-12-17 ENCOUNTER — Other Ambulatory Visit: Payer: Self-pay | Admitting: Family Medicine

## 2019-12-17 ENCOUNTER — Telehealth: Payer: Self-pay

## 2019-12-17 MED ORDER — AZITHROMYCIN 250 MG PO TABS: ORAL_TABLET | ORAL | 0 refills | Status: DC

## 2019-12-17 MED ORDER — PREDNISONE 10 MG PO TABS: ORAL_TABLET | ORAL | 0 refills | Status: DC

## 2019-12-17 NOTE — Telephone Encounter (Signed)
Copied from CRM [id number]. Topic: General - Other >> Dec 17, 2019  9:27 AM Lyn Hollingshead D wrote: PT was seen on the 10/14, he has questions about the medications he was prescribe on the appt / please advise

## 2019-12-17 NOTE — Telephone Encounter (Signed)
Patient advised of medications being sent to pharmacy.

## 2019-12-20 ENCOUNTER — Other Ambulatory Visit: Payer: Self-pay

## 2019-12-20 ENCOUNTER — Encounter: Payer: Self-pay | Admitting: Family Medicine

## 2019-12-20 ENCOUNTER — Ambulatory Visit (INDEPENDENT_AMBULATORY_CARE_PROVIDER_SITE_OTHER): Payer: BC Managed Care – PPO | Admitting: Family Medicine

## 2019-12-20 VITALS — BP 124/73 | HR 83 | Temp 98.3°F | Wt 267.2 lb

## 2019-12-20 DIAGNOSIS — M7021 Olecranon bursitis, right elbow: Secondary | ICD-10-CM

## 2019-12-20 NOTE — Progress Notes (Signed)
BP 124/73   Pulse 83   Temp 98.3 F (36.8 C) (Oral)   Wt 267 lb 3.2 oz (121.2 kg)   SpO2 95%   BMI 36.24 kg/m    Subjective:    Patient ID: Kenneth Blair, male    DOB: 09-Jan-1961, 59 y.o.   MRN: [id number]  HPI: Kenneth Blair is a 59 y.o. male  Chief Complaint  Patient presents with  . Bursitis    R elbow   Olecranon bursitis has not changed. No redness, no heat. He is otherwise feeling well. No other concerns.   Relevant past medical, surgical, family and social history reviewed and updated as indicated. Interim medical history since our last visit reviewed. Allergies and medications reviewed and updated.  Review of Systems  Constitutional: Negative.   Respiratory: Negative.   Cardiovascular: Negative.   Gastrointestinal: Negative.   Musculoskeletal: Positive for joint swelling. Negative for arthralgias, back pain, gait problem, myalgias, neck pain and neck stiffness.  Skin: Negative.   Neurological: Negative.   Psychiatric/Behavioral: Negative.     Per HPI unless specifically indicated above     Objective:    BP 124/73   Pulse 83   Temp 98.3 F (36.8 C) (Oral)   Wt 267 lb 3.2 oz (121.2 kg)   SpO2 95%   BMI 36.24 kg/m   Wt Readings from Last 3 Encounters:  12/20/19 267 lb 3.2 oz (121.2 kg)  12/13/19 266 lb (120.7 kg)  11/26/19 263 lb (119.3 kg)    Physical Exam Vitals and nursing note reviewed.  Constitutional:      General: He is not in acute distress.    Appearance: Normal appearance. He is not ill-appearing, toxic-appearing or diaphoretic.  HENT:     Head: Normocephalic and atraumatic.     Right Ear: External ear normal.     Left Ear: External ear normal.     Nose: Nose normal.     Mouth/Throat:     Mouth: Mucous membranes are moist.     Pharynx: Oropharynx is clear.  Eyes:     General: No scleral icterus.       Right eye: No discharge.        Left eye: No discharge.     Extraocular Movements: Extraocular movements intact.      Conjunctiva/sclera: Conjunctivae normal.     Pupils: Pupils are equal, round, and reactive to light.  Cardiovascular:     Rate and Rhythm: Normal rate and regular rhythm.     Pulses: Normal pulses.     Heart sounds: Normal heart sounds. No murmur heard.  No friction rub. No gallop.   Pulmonary:     Effort: Pulmonary effort is normal. No respiratory distress.     Breath sounds: Normal breath sounds. No stridor. No wheezing, rhonchi or rales.  Chest:     Chest wall: No tenderness.  Musculoskeletal:        General: Normal range of motion.     Cervical back: Normal range of motion and neck supple.     Comments: Olecranon bursitis R elbow  Skin:    General: Skin is warm and dry.     Capillary Refill: Capillary refill takes less than 2 seconds.     Coloration: Skin is not jaundiced or pale.     Findings: No bruising, erythema, lesion or rash.  Neurological:     General: No focal deficit present.     Mental Status: He is alert and oriented to person, place,  and time. Mental status is at baseline.  Psychiatric:        Mood and Affect: Mood normal.        Behavior: Behavior normal.        Thought Content: Thought content normal.        Judgment: Judgment normal.     Results for orders placed or performed in visit on 12/13/19  Novel Coronavirus, NAA ([facility])   Specimen: Saline  Result Value Ref Range   SARS-CoV-2, NAA Not Detected Not Detected  SARS-COV-2, NAA 2 DAY TAT  Result Value Ref Range   SARS-CoV-2, NAA 2 DAY TAT Performed       Assessment & Plan:   Problem List Items Addressed This Visit    None    Visit Diagnoses    Olecranon bursitis of right elbow    -  Primary   Drained today as below      Procedure: Right Olecranon Bursitis Steroid Injection        Diagnosis:   ICD-10-CM   1. Olecranon bursitis of right elbow  M70.21    Drained today as below    Physician: MJ Consent:  Risks, benefits, and alternative treatments discussed and all questions were  answered.  Patient elected to proceed and verbal consent obtained.  Description: Area prepped and draped using  semi-sterile technique.  Area infiltrated with 18 guage needle. 16cc serosanguinous fluid removed. Then injected with 20mg  triamcinalone and 2cc 1% lidocaine without. Area dressed with triple antibiotic ointment Complications: none Post Procedure Instructions: Wound care instructions discussed and patient was instructed to keep area clean and dry.  Signs and symptoms of infection discussed, patient agrees to contact the office ASAP should they occur.  Follow Up: Return as scheduled.   Follow up plan: Return as scheduled.

## 2019-12-24 DIAGNOSIS — G4733 Obstructive sleep apnea (adult) (pediatric): Secondary | ICD-10-CM | POA: Diagnosis not present

## 2019-12-25 ENCOUNTER — Other Ambulatory Visit: Payer: Self-pay | Admitting: Nurse Practitioner

## 2019-12-25 DIAGNOSIS — R739 Hyperglycemia, unspecified: Secondary | ICD-10-CM

## 2019-12-31 ENCOUNTER — Ambulatory Visit (INDEPENDENT_AMBULATORY_CARE_PROVIDER_SITE_OTHER): Payer: BC Managed Care – PPO | Admitting: Family Medicine

## 2019-12-31 ENCOUNTER — Encounter: Payer: Self-pay | Admitting: Family Medicine

## 2019-12-31 ENCOUNTER — Other Ambulatory Visit: Payer: Self-pay

## 2019-12-31 VITALS — BP 108/54 | HR 79 | Temp 98.5°F | Wt 268.6 lb

## 2019-12-31 DIAGNOSIS — N529 Male erectile dysfunction, unspecified: Secondary | ICD-10-CM | POA: Diagnosis not present

## 2019-12-31 DIAGNOSIS — I10 Essential (primary) hypertension: Secondary | ICD-10-CM

## 2019-12-31 DIAGNOSIS — J441 Chronic obstructive pulmonary disease with (acute) exacerbation: Secondary | ICD-10-CM | POA: Diagnosis not present

## 2019-12-31 DIAGNOSIS — B370 Candidal stomatitis: Secondary | ICD-10-CM | POA: Diagnosis not present

## 2019-12-31 MED ORDER — TADALAFIL 20 MG PO TABS: 10.0000 mg | ORAL_TABLET | ORAL | 11 refills | Status: AC | PRN

## 2019-12-31 MED ORDER — AMLODIPINE BESYLATE 10 MG PO TABS: 5.0000 mg | ORAL_TABLET | Freq: Every day | ORAL | 1 refills | Status: AC

## 2019-12-31 MED ORDER — NYSTATIN 100000 UNIT/ML MT SUSP: 5.0000 mL | Freq: Four times a day (QID) | OROMUCOSAL | 0 refills | Status: AC

## 2019-12-31 NOTE — Assessment & Plan Note (Signed)
Resolved. Lungs clear. Call with any concerns. Continue dulera.

## 2019-12-31 NOTE — Assessment & Plan Note (Signed)
BP running low again. Will decrease amlodipine back to 5mg . Call with any concerns. Continue to monitor.

## 2019-12-31 NOTE — Progress Notes (Signed)
BP (!) 108/54   Pulse 79   Temp 98.5 F (36.9 C) (Oral)   Wt 268 lb 9.6 oz (121.8 kg)   SpO2 98%   BMI 36.43 kg/m    Subjective:    Patient ID: Kenneth Blair, male    DOB: [birth year], 59 y.o.   MRN: [id number]  HPI: Kenneth Blair is a 59 y.o. male  Chief Complaint  Patient presents with  . Hypertension  . Medication Problem    pt states that he started th Dulera inhaler and it has been making his throat sore and leaving a burning feeling in his mouth    Lungs clear. No more cough. No fevers. No wheezing. He is otherwise feeling well.   Has been having issues with ED. No issues with libido- just with maintaining erection. Requests medicine.   Has been having irritation in his mouth with the use of the dulera.   HYPERTENSION Hypertension status: over treated  Satisfied with current treatment? no Duration of hypertension: chronic BP monitoring frequency:  a few times a week BP medication side effects:  no Medication compliance: excellent compliance Previous BP meds: lisinopril- HCTZ, amlodipine Aspirin: no Recurrent headaches: no Visual changes: no Palpitations: no Dyspnea: no Chest pain: no Lower extremity edema: no Dizzy/lightheaded: yes  Relevant past medical, surgical, family and social history reviewed and updated as indicated. Interim medical history since our last visit reviewed. Allergies and medications reviewed and updated.  Review of Systems  Constitutional: Negative.   HENT: Positive for sore throat. Negative for congestion, dental problem, drooling, ear discharge, ear pain, facial swelling, hearing loss, mouth sores, nosebleeds, postnasal drip, rhinorrhea, sinus pressure, sinus pain, sneezing, tinnitus, trouble swallowing and voice change.   Respiratory: Negative.   Cardiovascular: Negative.   Gastrointestinal: Negative.   Psychiatric/Behavioral: Negative.     Per HPI unless specifically indicated above     Objective:    BP (!) 108/54    Pulse 79   Temp 98.5 F (36.9 C) (Oral)   Wt 268 lb 9.6 oz (121.8 kg)   SpO2 98%   BMI 36.43 kg/m   Wt Readings from Last 3 Encounters:  12/31/19 268 lb 9.6 oz (121.8 kg)  12/20/19 267 lb 3.2 oz (121.2 kg)  12/13/19 266 lb (120.7 kg)    Physical Exam Vitals and nursing note reviewed.  Constitutional:      General: He is not in acute distress.    Appearance: Normal appearance. He is not ill-appearing, toxic-appearing or diaphoretic.  HENT:     Head: Normocephalic and atraumatic.     Right Ear: External ear normal.     Left Ear: External ear normal.     Nose: Nose normal.     Mouth/Throat:     Mouth: Mucous membranes are moist.     Pharynx: Oropharynx is clear.  Eyes:     General: No scleral icterus.       Right eye: No discharge.        Left eye: No discharge.     Extraocular Movements: Extraocular movements intact.     Conjunctiva/sclera: Conjunctivae normal.     Pupils: Pupils are equal, round, and reactive to light.  Cardiovascular:     Rate and Rhythm: Normal rate and regular rhythm.     Pulses: Normal pulses.     Heart sounds: Normal heart sounds. No murmur heard.  No friction rub. No gallop.   Pulmonary:     Effort: Pulmonary effort is normal. No respiratory  distress.     Breath sounds: Normal breath sounds. No stridor. No wheezing, rhonchi or rales.  Chest:     Chest wall: No tenderness.  Musculoskeletal:        General: Normal range of motion.     Cervical back: Normal range of motion and neck supple.  Skin:    General: Skin is warm and dry.     Capillary Refill: Capillary refill takes less than 2 seconds.     Coloration: Skin is not jaundiced or pale.     Findings: No bruising, erythema, lesion or rash.  Neurological:     General: No focal deficit present.     Mental Status: He is alert and oriented to person, place, and time. Mental status is at baseline.  Psychiatric:        Mood and Affect: Mood normal.        Behavior: Behavior normal.         Thought Content: Thought content normal.        Judgment: Judgment normal.     Results for orders placed or performed in visit on 12/13/19  Novel Coronavirus, NAA ([facility])   Specimen: Saline  Result Value Ref Range   SARS-CoV-2, NAA Not Detected Not Detected  SARS-COV-2, NAA 2 DAY TAT  Result Value Ref Range   SARS-CoV-2, NAA 2 DAY TAT Performed       Assessment & Plan:   Problem List Items Addressed This Visit      Cardiovascular and Mediastinum   Hypertension - Primary    BP running low again. Will decrease amlodipine back to 5mg . Call with any concerns. Continue to monitor.       Relevant Medications   amLODipine (NORVASC) 10 MG tablet   tadalafil (CIALIS) 20 MG tablet     Respiratory   COPD with acute exacerbation (HCC)    Resolved. Lungs clear. Call with any concerns. Continue dulera.       Other Visit Diagnoses    Thrush       Will treat with nystatin. Call with any concerns or if not getting better.    Relevant Medications   nystatin (MYCOSTATIN) 100000 UNIT/ML suspension       Follow up plan: Return in about 6 months (around 06/29/2020) for physical.  >25 minutes spent with patient today.

## 2020-01-02 ENCOUNTER — Other Ambulatory Visit: Payer: Self-pay | Admitting: Family Medicine

## 2020-01-02 DIAGNOSIS — I10 Essential (primary) hypertension: Secondary | ICD-10-CM

## 2020-01-02 NOTE — Telephone Encounter (Signed)
Requested Prescriptions  Pending Prescriptions Disp Refills  . lisinopril-hydrochlorothiazide (ZESTORETIC) 20-25 MG tablet [Pharmacy Med Name: LISINOPRIL-HCTZ 20-25 MG TAB] 90 tablet 1    Sig: TAKE 1 TABLET BY MOUTH EVERY DAY     Cardiovascular:  ACEI + Diuretic Combos Passed - 01/02/2020  1:21 AM      Passed - Na in normal range and within 180 days    Sodium  Date Value Ref Range Status  11/26/2019 137 134 - 144 mmol/L Final  05/30/2012 143 136 - 145 mmol/L Final         Passed - K in normal range and within 180 days    Potassium  Date Value Ref Range Status  11/26/2019 3.9 3.5 - 5.2 mmol/L Final  05/30/2012 4.2 3.5 - 5.1 mmol/L Final         Passed - Cr in normal range and within 180 days    Creatinine  Date Value Ref Range Status  05/30/2012 1.11 0.60 - 1.30 mg/dL Final   Creatinine, Ser  Date Value Ref Range Status  11/26/2019 1.12 0.76 - 1.27 mg/dL Final         Passed - Ca in normal range and within 180 days    Calcium  Date Value Ref Range Status  11/26/2019 9.0 8.7 - 10.2 mg/dL Final   Calcium, Total  Date Value Ref Range Status  05/30/2012 9.4 8.5 - 10.1 mg/dL Final         Passed - Patient is not pregnant      Passed - Last BP in normal range    BP Readings from Last 1 Encounters:  12/31/19 (!) 108/54         Passed - Valid encounter within last 6 months    Recent Outpatient Visits          2 days ago Primary hypertension   [facility] [place], Megan P, DO   1 week ago Olecranon bursitis of right elbow   [facility] [place], Megan P, DO   2 weeks ago Primary hypertension   [facility] [place], Megan P, DO   1 month ago Multifocal pneumonia   [facility] [place], Megan P, DO   6 months ago Routine general medical examination at a health care facility   [facility] [place], Oralia Rud, DO      Future Appointments            In 6 months Laural Benes, Oralia Rud, DO [employer],  [facility]

## 2020-01-23 ENCOUNTER — Other Ambulatory Visit: Payer: Self-pay | Admitting: Family Medicine

## 2020-01-30 DEATH — deceased

## 2020-03-10 ENCOUNTER — Telehealth: Payer: Self-pay

## 2020-03-10 NOTE — Telephone Encounter (Signed)
Called pt's wife Kenneth Blair she states that her insurance is denying her claim due to the pt's death certificate saying that his cause of death is from coronary artery disease. She is wanting to know what to do next. Please advise  Copied from CRM [id number]. Topic: Quick Communication - See Telephone Encounter >> Mar 10, 2020  3:23 PM Aretta Nip wrote: CRM for notification. See Telephone encounter for: 03/10/20. PT wife is calling, Kenneth Blair, wife of pt [phone] States she is needing to talk to Dr Shela Commons re something on the death certificate, extremely important

## 2020-03-10 NOTE — Telephone Encounter (Signed)
I'm not sure what to do with this.

## 2020-03-17 NOTE — Telephone Encounter (Signed)
Is it something that can be changed? If not, I will call her to explain. This must be a new insurance policy and they did not disclose his true health conditions. I've honestly never heard of something like this being denied.

## 2020-03-30 NOTE — Telephone Encounter (Signed)
It was on his CT scan in October, so I'm not sure I can change this. I'm still not sure what I can do about this unfortunately

## 2020-05-08 NOTE — Telephone Encounter (Signed)
LMOM for patients wife to return my call. Will inform patient's wife that  per Dr. Laural Benes the death certificate cannot be changed because the findings were indicated on the patient's CT scan from 10/21.

## 2020-07-03 ENCOUNTER — Encounter: Payer: BC Managed Care – PPO | Admitting: Family Medicine

## 2021-02-11 IMAGING — CR DG CHEST 2V
1 series · 2 of 2 positions shown · non-contrast
Comparison: 11/14/2019, CT 11/15/2019

CLINICAL DATA: 59-year-old male with history of smoking and
pneumonia

EXAM:
CHEST - 2 VIEW

[Series 1: dg chest 2 view · 0.14mm/px · 2 of 2 slices shown]
[im 1/2]
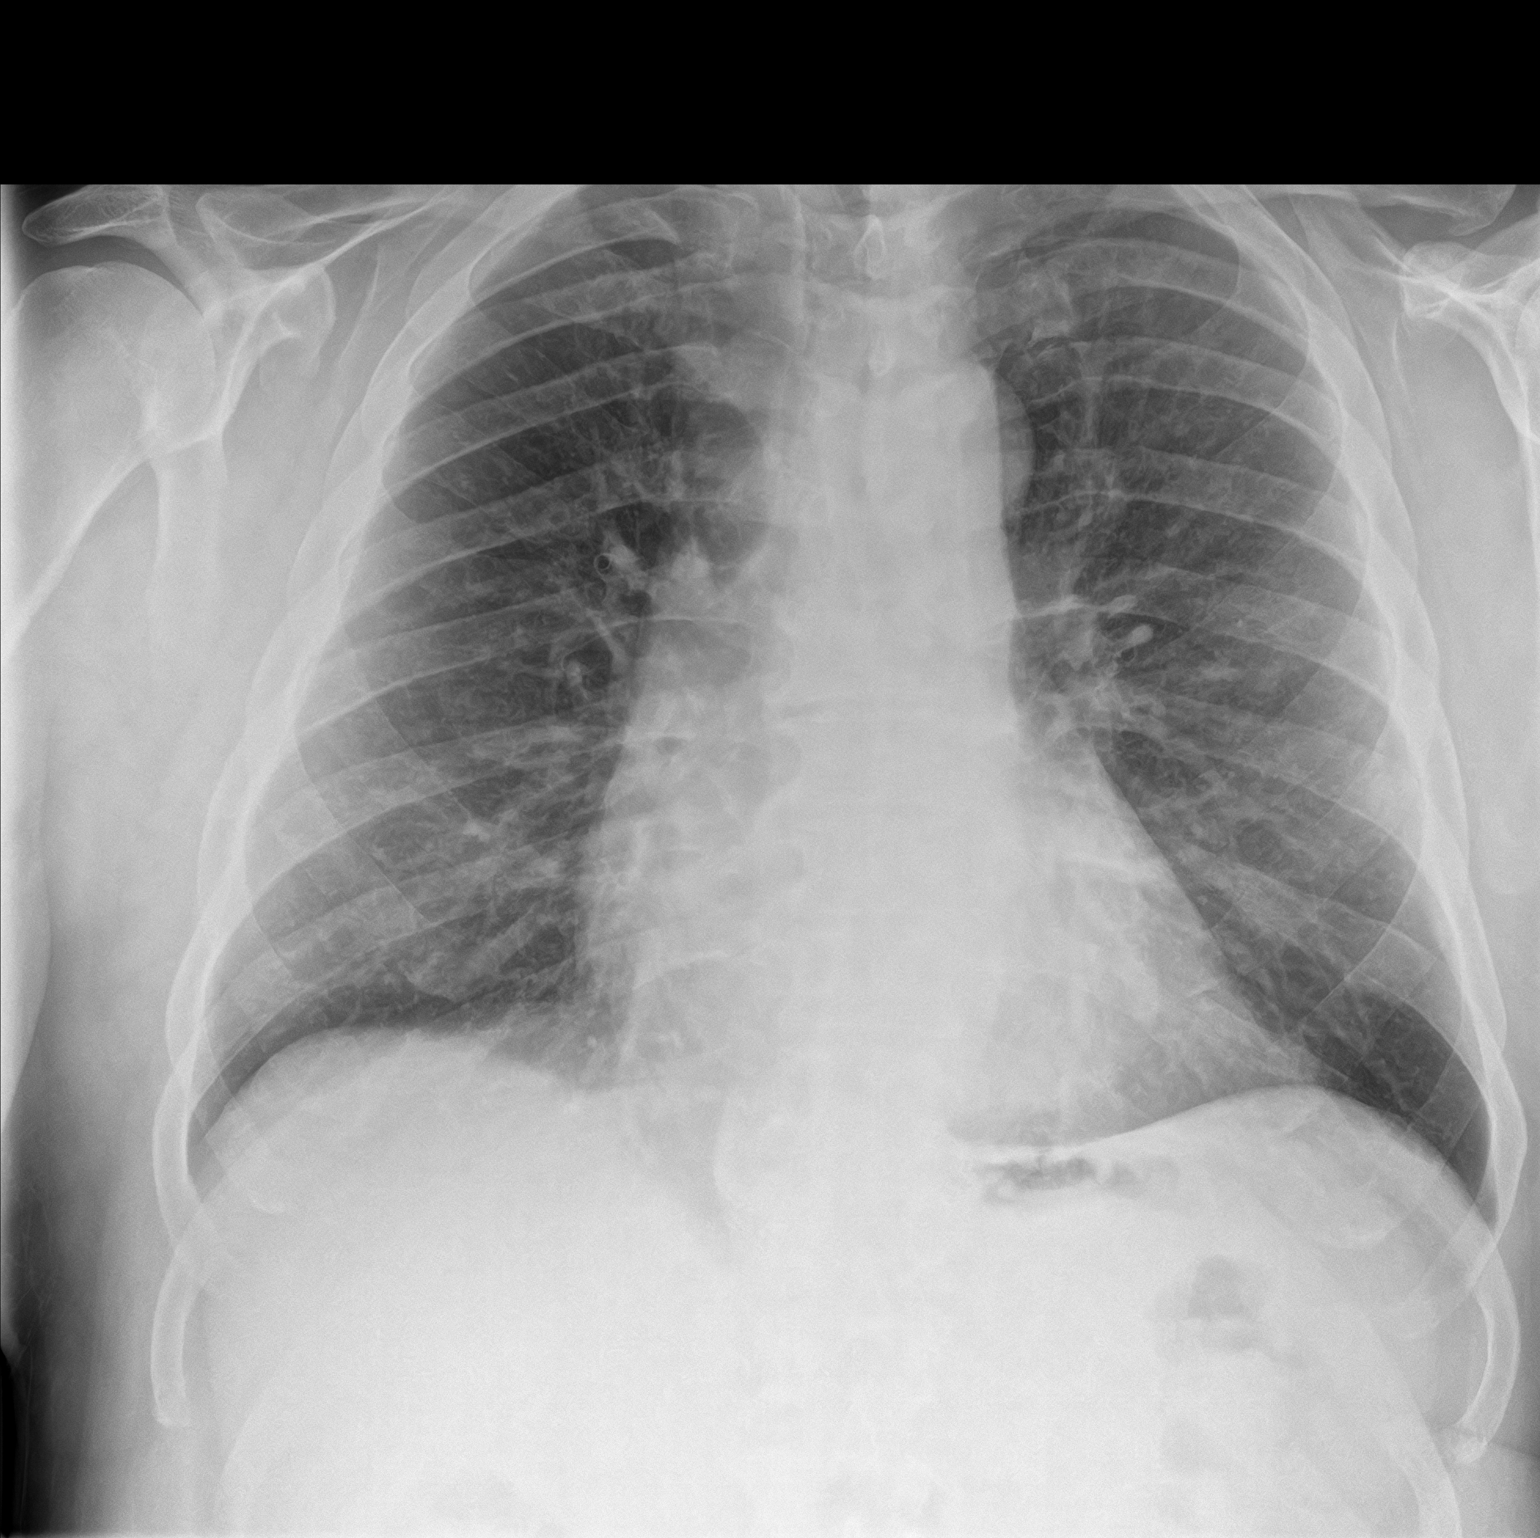
[im 2/2]
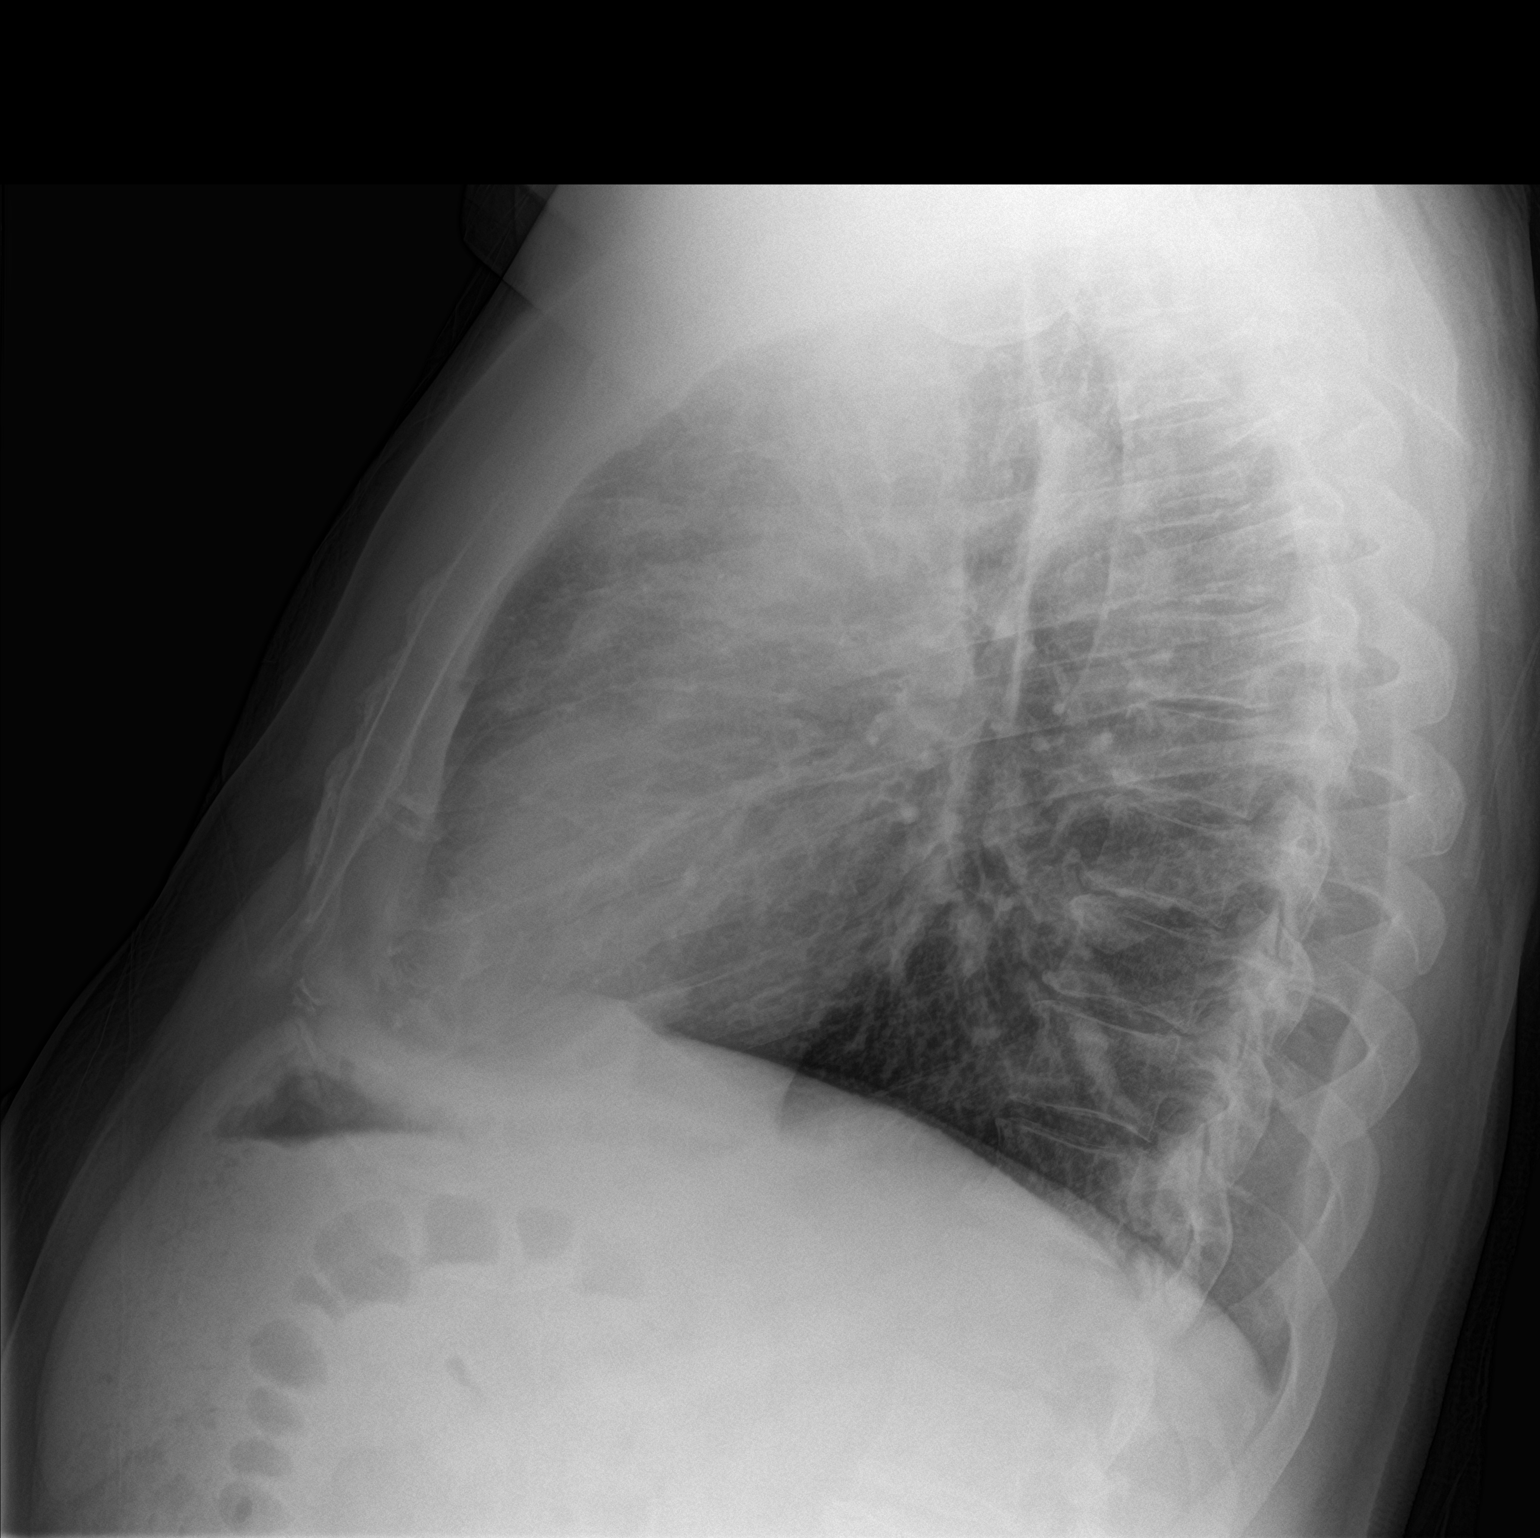

[2 of 2 positions shown; findings below may reference images not displayed]

FINDINGS: Cardiomediastinal silhouette unchanged in size and contour.

No interlobular septal thickening.

Similar appearance of reticulonodular opacities of the lungs with no
confluent airspace disease pneumothorax or pleural effusion.
IMPRESSION: Mild reticulonodular opacities, compatible with atypical/viral
infection.
